# Patient Record
Sex: Male | Born: 1940 | Race: Black or African American | Hispanic: No | Marital: Married | Smoking: Former smoker
Health system: Southern US, Community
[De-identification: ages and names within clinical notes are randomized; demographics above are authoritative.]

## PROBLEM LIST (undated history)

## (undated) DIAGNOSIS — R011 Cardiac murmur, unspecified: Secondary | ICD-10-CM

## (undated) DIAGNOSIS — F329 Major depressive disorder, single episode, unspecified: Secondary | ICD-10-CM

## (undated) DIAGNOSIS — F419 Anxiety disorder, unspecified: Secondary | ICD-10-CM

## (undated) DIAGNOSIS — H409 Unspecified glaucoma: Secondary | ICD-10-CM

## (undated) DIAGNOSIS — K5900 Constipation, unspecified: Secondary | ICD-10-CM

## (undated) DIAGNOSIS — K635 Polyp of colon: Secondary | ICD-10-CM

## (undated) DIAGNOSIS — F32A Depression, unspecified: Secondary | ICD-10-CM

## (undated) DIAGNOSIS — I10 Essential (primary) hypertension: Secondary | ICD-10-CM

## (undated) DIAGNOSIS — E079 Disorder of thyroid, unspecified: Secondary | ICD-10-CM

## (undated) HISTORY — DX: Constipation, unspecified: K59.00

## (undated) HISTORY — DX: Anxiety disorder, unspecified: F41.9

## (undated) HISTORY — DX: Cardiac murmur, unspecified: R01.1

## (undated) HISTORY — DX: Depression, unspecified: F32.A

## (undated) HISTORY — PX: COLONOSCOPY: SHX174

## (undated) HISTORY — DX: Disorder of thyroid, unspecified: E07.9

## (undated) HISTORY — DX: Major depressive disorder, single episode, unspecified: F32.9

## (undated) HISTORY — DX: Unspecified glaucoma: H40.9

## (undated) HISTORY — DX: Polyp of colon: K63.5

---

## 2013-08-12 ENCOUNTER — Encounter (INDEPENDENT_AMBULATORY_CARE_PROVIDER_SITE_OTHER): Payer: Self-pay

## 2013-08-12 ENCOUNTER — Ambulatory Visit: Payer: Medicare Other | Attending: Internal Medicine | Admitting: Internal Medicine

## 2013-08-12 ENCOUNTER — Encounter: Payer: Self-pay | Admitting: Internal Medicine

## 2013-08-12 VITALS — BP 160/90 | HR 62 | Temp 98.5°F | Resp 16 | Ht 65.0 in | Wt 166.0 lb

## 2013-08-12 DIAGNOSIS — E039 Hypothyroidism, unspecified: Secondary | ICD-10-CM | POA: Insufficient documentation

## 2013-08-12 DIAGNOSIS — Z008 Encounter for other general examination: Secondary | ICD-10-CM | POA: Insufficient documentation

## 2013-08-12 DIAGNOSIS — Z139 Encounter for screening, unspecified: Secondary | ICD-10-CM

## 2013-08-12 DIAGNOSIS — Z833 Family history of diabetes mellitus: Secondary | ICD-10-CM | POA: Insufficient documentation

## 2013-08-12 DIAGNOSIS — Z8249 Family history of ischemic heart disease and other diseases of the circulatory system: Secondary | ICD-10-CM | POA: Insufficient documentation

## 2013-08-12 DIAGNOSIS — H409 Unspecified glaucoma: Secondary | ICD-10-CM | POA: Insufficient documentation

## 2013-08-12 DIAGNOSIS — I10 Essential (primary) hypertension: Secondary | ICD-10-CM | POA: Insufficient documentation

## 2013-08-12 LAB — CBC WITH DIFFERENTIAL/PLATELET
Basophils Absolute: 0 10*3/uL (ref 0.0–0.1)
Basophils Relative: 1 % (ref 0–1)
EOS PCT: 3 % (ref 0–5)
Eosinophils Absolute: 0.1 10*3/uL (ref 0.0–0.7)
HCT: 41.3 % (ref 39.0–52.0)
HEMOGLOBIN: 14.1 g/dL (ref 13.0–17.0)
LYMPHS ABS: 1.9 10*3/uL (ref 0.7–4.0)
LYMPHS PCT: 49 % — AB (ref 12–46)
MCH: 31.5 pg (ref 26.0–34.0)
MCHC: 34.1 g/dL (ref 30.0–36.0)
MCV: 92.2 fL (ref 78.0–100.0)
MONOS PCT: 15 % — AB (ref 3–12)
Monocytes Absolute: 0.6 10*3/uL (ref 0.1–1.0)
NEUTROS ABS: 1.3 10*3/uL — AB (ref 1.7–7.7)
Neutrophils Relative %: 32 % — ABNORMAL LOW (ref 43–77)
Platelets: 203 10*3/uL (ref 150–400)
RBC: 4.48 MIL/uL (ref 4.22–5.81)
RDW: 15 % (ref 11.5–15.5)
WBC: 3.9 10*3/uL — ABNORMAL LOW (ref 4.0–10.5)

## 2013-08-12 LAB — COMPLETE METABOLIC PANEL WITH GFR
ALBUMIN: 4 g/dL (ref 3.5–5.2)
ALT: 16 U/L (ref 0–53)
AST: 19 U/L (ref 0–37)
Alkaline Phosphatase: 45 U/L (ref 39–117)
BUN: 9 mg/dL (ref 6–23)
CO2: 27 meq/L (ref 19–32)
CREATININE: 0.89 mg/dL (ref 0.50–1.35)
Calcium: 9 mg/dL (ref 8.4–10.5)
Chloride: 105 mEq/L (ref 96–112)
GFR, Est Non African American: 86 mL/min
Glucose, Bld: 108 mg/dL — ABNORMAL HIGH (ref 70–99)
POTASSIUM: 3.9 meq/L (ref 3.5–5.3)
Sodium: 139 mEq/L (ref 135–145)
Total Bilirubin: 0.6 mg/dL (ref 0.2–1.2)
Total Protein: 7.1 g/dL (ref 6.0–8.3)

## 2013-08-12 LAB — LIPID PANEL
CHOL/HDL RATIO: 3.2 ratio
CHOLESTEROL: 196 mg/dL (ref 0–200)
HDL: 62 mg/dL (ref >39)
LDL Cholesterol: 121 mg/dL — ABNORMAL HIGH (ref 0–99)
Triglycerides: 64 mg/dL (ref <150)
VLDL: 13 mg/dL (ref 0–40)

## 2013-08-12 LAB — TSH: TSH: 1.112 u[IU]/mL (ref 0.350–4.500)

## 2013-08-12 MED ORDER — LEVOTHYROXINE SODIUM 100 MCG PO TABS: 100.0000 ug | ORAL_TABLET | Freq: Every day | ORAL | Status: DC

## 2013-08-12 MED ORDER — AMLODIPINE BESYLATE 5 MG PO TABS: 5.0000 mg | ORAL_TABLET | Freq: Every day | ORAL | Status: DC

## 2013-08-12 NOTE — Progress Notes (Signed)
Patient Demographics  Jeffrey Grimes, is a 73 y.o. male  UXN:[id number]  URK:[id number]  DOB - [birth year]  CC:  Chief Complaint  Patient presents with  . Establish Care  . Hypothyroidism       HPI: Jeffrey Grimes is a 73 y.o. male here today to establish medical care. Patient has history of hypothyroidism and is taking levothyroxine 100 mcg daily, also history of glaucoma following up with his ophthalmologist who has prescribed him eyedrops, today's blood pressure was elevated even repeat manual is 160/90, patient has strong family history of hypertension. Patient has No headache, No chest pain, No abdominal pain - No Nausea, No new weakness tingling or numbness, No Cough - SOB.  No Known Allergies Past Medical History  Diagnosis Date  . Thyroid disease    No current outpatient prescriptions on file prior to visit.   No current facility-administered medications on file prior to visit.   Family History  Problem Relation Age of Onset  . Diabetes Mother   . Hypertension Mother   . Heart disease Mother   . Hypertension Father   . Heart disease Father   . Thyroid disease Sister   . Hypertension Brother   . Diabetes Brother    History   Social History  . Marital Status: Married    Spouse Name: N/A    Number of Children: N/A  . Years of Education: N/A   Occupational History  . Not on file.   Social History Main Topics  . Smoking status: Not on file  . Smokeless tobacco: Not on file  . Alcohol Use: Yes     Comment: socially   . Drug Use: Not on file  . Sexual Activity: Not on file   Other Topics Concern  . Not on file   Social History Narrative  . No narrative on file    Review of Systems: Constitutional: Negative for fever, chills, diaphoresis, activity change, appetite change and fatigue. HENT: Negative for ear pain, nosebleeds, congestion, facial swelling, rhinorrhea, neck pain, neck stiffness and ear discharge.  Eyes: Negative for pain,  discharge, redness, itching and visual disturbance. Respiratory: Negative for cough, choking, chest tightness, shortness of breath, wheezing and stridor.  Cardiovascular: Negative for chest pain, palpitations and leg swelling. Gastrointestinal: Negative for abdominal distention. Genitourinary: Negative for dysuria, urgency, frequency, hematuria, flank pain, decreased urine volume, difficulty urinating and dyspareunia.  Musculoskeletal: Negative for back pain, joint swelling, arthralgia and gait problem. Neurological: Negative for dizziness, tremors, seizures, syncope, facial asymmetry, speech difficulty, weakness, light-headedness, numbness and headaches.  Hematological: Negative for adenopathy. Does not bruise/bleed easily. Psychiatric/Behavioral: Negative for hallucinations, behavioral problems, confusion, dysphoric mood, decreased concentration and agitation.    Objective:   Filed Vitals:   08/12/13 1430  BP: 160/90  Pulse:   Temp:   Resp:     Physical Exam: Constitutional: Patient appears well-developed and well-nourished. No distress. HENT: Normocephalic, atraumatic, External right and left ear normal. Oropharynx is clear and moist.  Eyes: Conjunctivae and EOM are normal. PERRLA, no scleral icterus. Neck: Normal ROM. Neck supple. No JVD. No tracheal deviation. No thyromegaly. CVS: RRR, S1/S2 +, no murmurs, no gallops, no carotid bruit.  Pulmonary: Effort and breath sounds normal, no stridor, rhonchi, wheezes, rales.  Abdominal: Soft. BS +, no distension, tenderness, rebound or guarding.  Musculoskeletal: Normal range of motion. No edema and no tenderness.  Neuro: Alert. Normal reflexes, muscle tone coordination. No cranial nerve deficit. Skin: Skin is warm and dry. No rash  noted. Not diaphoretic. No erythema. No pallor. Psychiatric: Normal mood and affect. Behavior, judgment, thought content normal.  No results found for this basename: WBC, HGB, HCT, MCV, PLT   No results  found for this basename: CREATININE, BUN, NA, K, CL, CO2    No results found for this basename: HGBA1C   Lipid Panel  No results found for this basename: chol, trig, hdl, cholhdl, vldl, ldlcalc       Assessment and plan:   1. Unspecified hypothyroidism  - TSH level - levothyroxine (SYNTHROID, LEVOTHROID) 100 MCG tablet; Take 1 tablet (100 mcg total) by mouth daily.  Dispense: 90 tablet; Refill: 3  2. Glaucoma Continue to follow with the ophthalmologist  4. Essential hypertension, benign I Have started patient on Norvasc 5 mg, also advise for low salt diet, will come back in 2 weeks for BP check  - CBC with Differential - COMPLETE METABOLIC PANEL WITH GFR - Lipid panel - amLODipine (NORVASC) 5 MG tablet; Take 1 tablet (5 mg total) by mouth daily.  Dispense: 90 tablet; Refill: 3      Health Maintenance -Colonoscopy: Up-to-date   Return in about 6 weeks (around 09/23/2013) for BP check in 2 weeks .     [name], MD

## 2013-08-12 NOTE — Patient Instructions (Signed)
2 Gram Low Sodium Diet A 2 gram sodium diet restricts the amount of sodium in the diet to no more than 2 g or 2000 mg daily. Limiting the amount of sodium is often used to help lower blood pressure. It is important if you have heart, liver, or kidney problems. Many foods contain sodium for flavor and sometimes as a preservative. When the amount of sodium in a diet needs to be low, it is important to know what to look for when choosing foods and drinks. The following includes some information and guidelines to help make it easier for you to adapt to a low sodium diet. QUICK TIPS  Do not add salt to food.  Avoid convenience items and fast food.  Choose unsalted snack foods.  Buy lower sodium products, often labeled as "lower sodium" or "no salt added."  Check food labels to learn how much sodium is in 1 serving.  When eating at a restaurant, ask that your food be prepared with less salt or none, if possible. READING FOOD LABELS FOR SODIUM INFORMATION The nutrition facts label is a good place to find how much sodium is in foods. Look for products with no more than 500 to 600 mg of sodium per meal and no more than 150 mg per serving. Remember that 2 g = 2000 mg. The food label may also list foods as:  Sodium-free: Less than 5 mg in a serving.  Very low sodium: 35 mg or less in a serving.  Low-sodium: 140 mg or less in a serving.  Light in sodium: 50% less sodium in a serving. For example, if a food that usually has 300 mg of sodium is changed to become light in sodium, it will have 150 mg of sodium.  Reduced sodium: 25% less sodium in a serving. For example, if a food that usually has 400 mg of sodium is changed to reduced sodium, it will have 300 mg of sodium. CHOOSING FOODS Grains  Avoid: Salted crackers and snack items. Some cereals, including instant hot cereals. Bread stuffing and biscuit mixes. Seasoned rice or pasta mixes.  Choose: Unsalted snack items. Low-sodium cereals, oats,  puffed wheat and rice, shredded wheat. English muffins and bread. Pasta. Meats  Avoid: Salted, canned, smoked, spiced, pickled meats, including fish and poultry. Bacon, ham, sausage, cold cuts, hot dogs, anchovies.  Choose: Low-sodium canned tuna and salmon. Fresh or frozen meat, poultry, and fish. Dairy  Avoid: Processed cheese and spreads. Cottage cheese. Buttermilk and condensed milk. Regular cheese.  Choose: Milk. Low-sodium cottage cheese. Yogurt. Sour cream. Low-sodium cheese. Fruits and Vegetables  Avoid: Regular canned vegetables. Regular canned tomato sauce and paste. Frozen vegetables in sauces. Olives. Pickles. Relishes. Sauerkraut.  Choose: Low-sodium canned vegetables. Low-sodium tomato sauce and paste. Frozen or fresh vegetables. Fresh and frozen fruit. Condiments  Avoid: Canned and packaged gravies. Worcestershire sauce. Tartar sauce. Barbecue sauce. Soy sauce. Steak sauce. Ketchup. Onion, garlic, and table salt. Meat flavorings and tenderizers.  Choose: Fresh and dried herbs and spices. Low-sodium varieties of mustard and ketchup. Lemon juice. Tabasco sauce. Horseradish. SAMPLE 2 GRAM SODIUM MEAL PLAN Breakfast / Sodium (mg)  1 cup low-fat milk / 143 mg  2 slices whole-wheat toast / 270 mg  1 tbs heart-healthy margarine / 153 mg  1 hard-boiled egg / 139 mg  1 small orange / 0 mg Lunch / Sodium (mg)  1 cup raw carrots / 76 mg   cup hummus / 298 mg  1 cup low-fat milk /   143 mg   cup red grapes / 2 mg  1 whole-wheat pita bread / 356 mg Dinner / Sodium (mg)  1 cup whole-wheat pasta / 2 mg  1 cup low-sodium tomato sauce / 73 mg  3 oz lean ground beef / 57 mg  1 small side salad (1 cup raw spinach leaves,  cup cucumber,  cup yellow bell pepper) with 1 tsp olive oil and 1 tsp red wine vinegar / 25 mg Snack / Sodium (mg)  1 container low-fat vanilla yogurt / 107 mg  3 graham cracker squares / 127 mg Nutrient Analysis  Calories: 2033  Protein:  77 g  Carbohydrate: 282 g  Fat: 72 g  Sodium: 1971 mg Document Released: 06/20/2005 Document Revised: 09/12/2011 Document Reviewed: 09/21/2009 Exit[employer] Patient Information 2014 [employer].  

## 2013-08-12 NOTE — Progress Notes (Signed)
Pt here to establish care for thyroid disease. States he ran out of Synthroid 0.1 mg tab Also taking Combigan 0.2/0.5 mg

## 2013-08-13 ENCOUNTER — Telehealth: Payer: Self-pay

## 2013-08-13 NOTE — Telephone Encounter (Signed)
Message copied by Dorothe Pea on Tue Aug 13, 2013  2:40 PM ------      Message from: [name]      Created: Tue Aug 13, 2013  2:31 PM       Blood work reviewed noticed impaired fasting glucose, call and advise patient for low carbohydrate diet.             ------

## 2013-08-13 NOTE — Telephone Encounter (Signed)
Patient not available Left message on voice mail to return call

## 2013-08-27 ENCOUNTER — Ambulatory Visit: Payer: Medicare Other | Attending: Internal Medicine

## 2013-08-27 NOTE — Patient Instructions (Signed)
Pt instructed to keep daily strict log of blood pressure and return next Tuesday for recheck

## 2013-08-27 NOTE — Progress Notes (Unsigned)
Subjective:     Patient ID: Jeffrey Grimes, male   DOB: [birth year], 73 y.o.   MRN: [id number]  HPI   Review of Systems     Objective:   Physical Exam     Assessment:     ***    Plan:     ***     Pt comes in for blood pressure recheck. Taking prescribed Norvasc 5 mg tab daily Denies h/a or pain BP 166/88 Pt told to keep daily bp home monitoring and return next week

## 2013-09-03 ENCOUNTER — Ambulatory Visit: Payer: Medicare Other | Attending: Internal Medicine

## 2013-09-03 NOTE — Patient Instructions (Signed)
Pt instructed to continue taking prescribed medication and return at next appt

## 2013-09-03 NOTE — Progress Notes (Unsigned)
Subjective:     Patient ID: Jeffrey Grimes, male   DOB: 1941-02-20, 73 y.o.   MRN: [id number]  HPI   Review of Systems     Objective:   Physical Exam     Assessment:     ***    Plan:     ***     Pt here for blood pressure recheck. Pt brought in daily bp log ranging 130-140's/70-80's  Taking Amlodopine 5 mg tab. May need to increase at next visit BP 163/90 rechecked manually post 10 minutes 145/87 73 Instructed to continue taking dosage and return at next scheduled appt

## 2013-09-12 ENCOUNTER — Ambulatory Visit: Payer: Medicare Other | Attending: Internal Medicine | Admitting: Internal Medicine

## 2013-09-12 VITALS — BP 146/88 | HR 68 | Temp 98.6°F | Resp 16 | Ht 65.0 in | Wt 165.0 lb

## 2013-09-12 DIAGNOSIS — I10 Essential (primary) hypertension: Secondary | ICD-10-CM

## 2013-09-12 DIAGNOSIS — G4700 Insomnia, unspecified: Secondary | ICD-10-CM

## 2013-09-12 DIAGNOSIS — R7301 Impaired fasting glucose: Secondary | ICD-10-CM

## 2013-09-12 DIAGNOSIS — E039 Hypothyroidism, unspecified: Secondary | ICD-10-CM

## 2013-09-12 NOTE — Progress Notes (Signed)
MRN: [id number] Name: Jeffrey Grimes  Sex: male Age: 73 y.o. DOB: [birth year]  Allergies: Review of patient's allergies indicates no known allergies.  Chief Complaint  Patient presents with  . Follow-up  . Hypertension    HPI: Patient is 73 y.o. male who comes today for followup, history of hypothyroidism and is on levothyroxine, history of hypertension last visit he was started on Norvasc 5 mg his blood pressure has trended down, denies any headache dizziness chest and shortness of breath, blood work reviewed with the patient, patient reported to have problem with the sleep.  Past Medical History  Diagnosis Date  . Thyroid disease     Past Surgical History  Procedure Laterality Date  . Colonoscopy      last done at the age ot 45, had total of 3 times done       Medication List       This list is accurate as of: 09/12/13 11:03 AM.  Always use your most recent med list.               amLODipine 5 MG tablet  Commonly known as:  NORVASC  Take 1 tablet (5 mg total) by mouth daily.     levothyroxine 100 MCG tablet  Commonly known as:  SYNTHROID, LEVOTHROID  Take 1 tablet (100 mcg total) by mouth daily.        No orders of the defined types were placed in this encounter.     There is no immunization history on file for this patient.  Family History  Problem Relation Age of Onset  . Diabetes Mother   . Hypertension Mother   . Heart disease Mother   . Hypertension Father   . Heart disease Father   . Thyroid disease Sister   . Hypertension Brother   . Diabetes Brother     History  Substance Use Topics  . Smoking status: Not on file  . Smokeless tobacco: Not on file  . Alcohol Use: Yes     Comment: socially     Review of Systems   As noted in HPI  Filed Vitals:   09/12/13 1040  BP: 146/88  Pulse: 68  Temp: 98.6 F (37 C)  Resp: 16    Physical Exam  Physical Exam  Constitutional: No distress.  Eyes: EOM are normal. Pupils are equal,  round, and reactive to light.  Cardiovascular: Normal rate and regular rhythm.   Pulmonary/Chest: Breath sounds normal. No respiratory distress. He has no wheezes. He has no rales.  Musculoskeletal: He exhibits no edema.    CBC    Component Value Date/Time   WBC 3.9* 08/12/2013 1437   RBC 4.48 08/12/2013 1437   HGB 14.1 08/12/2013 1437   HCT 41.3 08/12/2013 1437   PLT 203 08/12/2013 1437   MCV 92.2 08/12/2013 1437   LYMPHSABS 1.9 08/12/2013 1437   MONOABS 0.6 08/12/2013 1437   EOSABS 0.1 08/12/2013 1437   BASOSABS 0.0 08/12/2013 1437    CMP     Component Value Date/Time   NA 139 08/12/2013 1437   K 3.9 08/12/2013 1437   CL 105 08/12/2013 1437   CO2 27 08/12/2013 1437   GLUCOSE 108* 08/12/2013 1437   BUN 9 08/12/2013 1437   CREATININE 0.89 08/12/2013 1437   CALCIUM 9.0 08/12/2013 1437   PROT 7.1 08/12/2013 1437   ALBUMIN 4.0 08/12/2013 1437   AST 19 08/12/2013 1437   ALT 16 08/12/2013 1437   ALKPHOS 45 08/12/2013 1437  BILITOT 0.6 08/12/2013 1437    Lab Results  Component Value Date/Time   CHOL 196 08/12/2013  2:37 PM    No components found with this basename: hga1c    Lab Results  Component Value Date/Time   AST 19 08/12/2013  2:37 PM    Assessment and Plan  Essential hypertension, benign Improved her continue with Norvasc 5 mg, advise for low salt diet. Her  Unspecified hypothyroidism TSH level is within normal range continue with levothyroxine 100 mcg daily  IFG (impaired fasting glucose) Advise patient for low carbohydrate diet   Insomnia Advised patient for lifestyle modification and sleep hygiene, patient will try over-the-counter melatonin.   Return in about 3 months (around 12/13/2013) for hypertension, hypothyroid.  [name], MD

## 2013-09-12 NOTE — Patient Instructions (Signed)
DASH Diet  The DASH diet stands for "Dietary Approaches to Stop Hypertension." It is a healthy eating plan that has been shown to reduce high blood pressure (hypertension) in as little as 14 days, while also possibly providing other significant health benefits. These other health benefits include reducing the risk of breast cancer after menopause and reducing the risk of type 2 diabetes, heart disease, colon cancer, and stroke. Health benefits also include weight loss and slowing kidney failure in patients with chronic kidney disease.   DIET GUIDELINES  · Limit salt (sodium). Your diet should contain less than 1500 mg of sodium daily.  · Limit refined or processed carbohydrates. Your diet should include mostly whole grains. Desserts and added sugars should be used sparingly.  · Include small amounts of heart-healthy fats. These types of fats include nuts, oils, and tub margarine. Limit saturated and trans fats. These fats have been shown to be harmful in the body.  CHOOSING FOODS   The following food groups are based on a 2000 calorie diet. See your Registered Dietitian for individual calorie needs.  Grains and Grain Products (6 to 8 servings daily)  · Eat More Often: Whole-wheat bread, brown rice, whole-grain or wheat pasta, quinoa, popcorn without added fat or salt (air popped).  · Eat Less Often: White bread, white pasta, white rice, cornbread.  Vegetables (4 to 5 servings daily)  · Eat More Often: Fresh, frozen, and canned vegetables. Vegetables may be raw, steamed, roasted, or grilled with a minimal amount of fat.  · Eat Less Often/Avoid: Creamed or fried vegetables. Vegetables in a cheese sauce.  Fruit (4 to 5 servings daily)  · Eat More Often: All fresh, canned (in natural juice), or frozen fruits. Dried fruits without added sugar. One hundred percent fruit juice (½ cup [237 mL] daily).  · Eat Less Often: Dried fruits with added sugar. Canned fruit in light or heavy syrup.  Lean Meats, Fish, and Poultry (2  servings or less daily. One serving is 3 to 4 oz [85-114 g]).  · Eat More Often: Ninety percent or leaner ground beef, tenderloin, sirloin. Round cuts of beef, chicken breast, turkey breast. All fish. Grill, bake, or broil your meat. Nothing should be fried.  · Eat Less Often/Avoid: Fatty cuts of meat, turkey, or chicken leg, thigh, or wing. Fried cuts of meat or fish.  Dairy (2 to 3 servings)  · Eat More Often: Low-fat or fat-free milk, low-fat plain or light yogurt, reduced-fat or part-skim cheese.  · Eat Less Often/Avoid: Milk (whole, 2%). Whole milk yogurt. Full-fat cheeses.  Nuts, Seeds, and Legumes (4 to 5 servings per week)  · Eat More Often: All without added salt.  · Eat Less Often/Avoid: Salted nuts and seeds, canned beans with added salt.  Fats and Sweets (limited)  · Eat More Often: Vegetable oils, tub margarines without trans fats, sugar-free gelatin. Mayonnaise and salad dressings.  · Eat Less Often/Avoid: Coconut oils, palm oils, butter, stick margarine, cream, half and half, cookies, candy, pie.  FOR MORE INFORMATION  The Dash Diet Eating Plan: www.dashdiet.org  Document Released: 06/09/2011 Document Revised: 09/12/2011 Document Reviewed: 06/09/2011  Exit[employer]® Patient Information ©2014 [employer].

## 2013-12-16 ENCOUNTER — Encounter: Payer: Self-pay | Admitting: Internal Medicine

## 2013-12-17 ENCOUNTER — Encounter: Payer: Self-pay | Admitting: Podiatry

## 2013-12-17 ENCOUNTER — Ambulatory Visit (INDEPENDENT_AMBULATORY_CARE_PROVIDER_SITE_OTHER): Payer: Medicare Other | Admitting: Podiatry

## 2013-12-17 VITALS — BP 137/67 | HR 63 | Ht 65.0 in | Wt 166.0 lb

## 2013-12-17 DIAGNOSIS — B351 Tinea unguium: Secondary | ICD-10-CM

## 2013-12-17 DIAGNOSIS — M79606 Pain in leg, unspecified: Secondary | ICD-10-CM | POA: Insufficient documentation

## 2013-12-17 DIAGNOSIS — M79609 Pain in unspecified limb: Secondary | ICD-10-CM

## 2013-12-17 DIAGNOSIS — Q828 Other specified congenital malformations of skin: Secondary | ICD-10-CM

## 2013-12-17 NOTE — Progress Notes (Signed)
SUBJECTIVE: 73 y.o. year old male presents for painful calluses on both feet. He himself trims them down every two weeks. They are painful to walk.  REVIEW OF SYSTEMS: Constitutional: negative for chills, fatigue, fevers, night sweats and weight loss Eyes: Has Glaucoma. Ears, nose, mouth, throat, and face: negative Respiratory: negative Cardiovascular: negative Gastrointestinal: negative Genitourinary:negative Musculoskeletal:negative Neurological: negative.  OBJECTIVE: DERMATOLOGIC EXAMINATION: Nails: Thick dystrophic nails x 10. Plantar Calluses ball of both feet. VASCULAR EXAMINATION OF LOWER LIMBS: Pedal pulses: All pedal pulses are palpable with normal pulsation.  NEUROLOGIC EXAMINATION OF THE LOWER LIMBS: All epicritic and tactile sensations grossly intact bilateral. 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   ASSESSMENT: Painful calluses bilateral. 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 Bilateral bunion. Pain in lower limb.  PLAN: Reviewed findings. All nails and calluses debrided.

## 2013-12-17 NOTE — Patient Instructions (Signed)
Seen for hypertrophic nails and painful calluses. All nails and calluses debrided. Return in 3 months or as needed.  

## 2014-01-13 ENCOUNTER — Telehealth: Payer: Self-pay | Admitting: *Deleted

## 2014-02-05 ENCOUNTER — Other Ambulatory Visit: Payer: Self-pay

## 2014-02-05 ENCOUNTER — Ambulatory Visit (HOSPITAL_COMMUNITY)
Admission: RE | Admit: 2014-02-05 | Discharge: 2014-02-05 | Disposition: A | Discharge status: Home / Self Care | Payer: Medicare Other | Source: Ambulatory Visit | Attending: Cardiology | Admitting: Cardiology

## 2014-02-05 ENCOUNTER — Ambulatory Visit: Payer: Medicare Other | Attending: Internal Medicine | Admitting: Internal Medicine

## 2014-02-05 ENCOUNTER — Encounter: Payer: Self-pay | Admitting: Internal Medicine

## 2014-02-05 VITALS — BP 160/90 | HR 67 | Temp 98.8°F | Resp 16 | Wt 165.4 lb

## 2014-02-05 DIAGNOSIS — Z8249 Family history of ischemic heart disease and other diseases of the circulatory system: Secondary | ICD-10-CM | POA: Diagnosis present

## 2014-02-05 DIAGNOSIS — F329 Major depressive disorder, single episode, unspecified: Secondary | ICD-10-CM

## 2014-02-05 DIAGNOSIS — Z87891 Personal history of nicotine dependence: Secondary | ICD-10-CM | POA: Insufficient documentation

## 2014-02-05 DIAGNOSIS — R7301 Impaired fasting glucose: Secondary | ICD-10-CM

## 2014-02-05 DIAGNOSIS — F3289 Other specified depressive episodes: Secondary | ICD-10-CM | POA: Insufficient documentation

## 2014-02-05 DIAGNOSIS — F32A Depression, unspecified: Secondary | ICD-10-CM

## 2014-02-05 DIAGNOSIS — I10 Essential (primary) hypertension: Secondary | ICD-10-CM

## 2014-02-05 DIAGNOSIS — E039 Hypothyroidism, unspecified: Secondary | ICD-10-CM | POA: Diagnosis not present

## 2014-02-05 LAB — COMPLETE METABOLIC PANEL WITH GFR
ALK PHOS: 49 U/L (ref 39–117)
ALT: 18 U/L (ref 0–53)
AST: 21 U/L (ref 0–37)
Albumin: 4 g/dL (ref 3.5–5.2)
BUN: 10 mg/dL (ref 6–23)
CALCIUM: 9.6 mg/dL (ref 8.4–10.5)
CHLORIDE: 104 meq/L (ref 96–112)
CO2: 29 mEq/L (ref 19–32)
Creat: 0.9 mg/dL (ref 0.50–1.35)
GFR, Est African American: >89 mL/min
GFR, Est Non African American: 86 mL/min
GLUCOSE: 110 mg/dL — AB (ref 70–99)
POTASSIUM: 4.3 meq/L (ref 3.5–5.3)
SODIUM: 138 meq/L (ref 135–145)
TOTAL PROTEIN: 7.3 g/dL (ref 6.0–8.3)
Total Bilirubin: 0.5 mg/dL (ref 0.2–1.2)

## 2014-02-05 MED ORDER — LISINOPRIL-HYDROCHLOROTHIAZIDE 10-12.5 MG PO TABS: 1.0000 | ORAL_TABLET | Freq: Every day | ORAL | Status: DC

## 2014-02-05 NOTE — Progress Notes (Signed)
MRN: [id number] Name: Jeffrey Grimes  Sex: male Age: 73 y.o. DOB: [birth year]  Allergies: Review of patient's allergies indicates no known allergies.  Chief Complaint  Patient presents with  . Follow-up  . Depression    HPI: Patient is 73 y.o. male who has history of hypothyroidism, hypertension patient was on Norvasc as per patient he took only for one month and discontinued because he didn't like the medication, today's blood pressure is elevated denies any headache dizziness chest pain or shortness of breath, patient does have family history of heart disease today EKG done in the office is normal sinus rhythm, patient also has history of depression as per patient he saw a [place] physician and was prescribed Paxil which he is going to start, denies any SI or HI.  Past Medical History  Diagnosis Date  . Thyroid disease     Past Surgical History  Procedure Laterality Date  . Colonoscopy      last done at the age ot 76, had total of 3 times done       Medication List       This list is accurate as of: 02/05/14 12:38 PM.  Always use your most recent med list.               levothyroxine 100 MCG tablet  Commonly known as:  SYNTHROID, LEVOTHROID  Take 1 tablet (100 mcg total) by mouth daily.     lisinopril-hydrochlorothiazide 10-12.5 MG per tablet  Commonly known as:  PRINZIDE,ZESTORETIC  Take 1 tablet by mouth daily.        Meds ordered this encounter  Medications  . lisinopril-hydrochlorothiazide (PRINZIDE,ZESTORETIC) 10-12.5 MG per tablet    Sig: Take 1 tablet by mouth daily.    Dispense:  90 tablet    Refill:  1     There is no immunization history on file for this patient.  Family History  Problem Relation Age of Onset  . Diabetes Mother   . Hypertension Mother   . Heart disease Mother   . Hypertension Father   . Heart disease Father   . Thyroid disease Sister   . Hypertension Brother   . Diabetes Brother     History  Substance Use Topics  .  Smoking status: Former Research scientist (life sciences)  . Smokeless tobacco: Never Used  . Alcohol Use: Yes     Comment: socially     Review of Systems   As noted in HPI  Filed Vitals:   02/05/14 1235  BP: 160/90  Pulse:   Temp:   Resp:     Physical Exam  Physical Exam  Constitutional: No distress.  Eyes: EOM are normal. Pupils are equal, round, and reactive to light.  Cardiovascular: Normal rate and regular rhythm.   Pulmonary/Chest: Breath sounds normal. No respiratory distress. He has no wheezes. He has no rales.  Musculoskeletal: He exhibits no edema.    CBC    Component Value Date/Time   WBC 3.9* 08/12/2013 1437   RBC 4.48 08/12/2013 1437   HGB 14.1 08/12/2013 1437   HCT 41.3 08/12/2013 1437   PLT 203 08/12/2013 1437   MCV 92.2 08/12/2013 1437   LYMPHSABS 1.9 08/12/2013 1437   MONOABS 0.6 08/12/2013 1437   EOSABS 0.1 08/12/2013 1437   BASOSABS 0.0 08/12/2013 1437    CMP     Component Value Date/Time   NA 139 08/12/2013 1437   K 3.9 08/12/2013 1437   CL 105 08/12/2013 1437   CO2 27 08/12/2013  1437   GLUCOSE 108* 08/12/2013 1437   BUN 9 08/12/2013 1437   CREATININE 0.89 08/12/2013 1437   CALCIUM 9.0 08/12/2013 1437   PROT 7.1 08/12/2013 1437   ALBUMIN 4.0 08/12/2013 1437   AST 19 08/12/2013 1437   ALT 16 08/12/2013 1437   ALKPHOS 45 08/12/2013 1437   BILITOT 0.6 08/12/2013 1437   GFRNONAA 86 08/12/2013 1437   GFRAA >89 08/12/2013 1437    Lab Results  Component Value Date/Time   CHOL 196 08/12/2013  2:37 PM    No components found with this basename: hga1c    Lab Results  Component Value Date/Time   AST 19 08/12/2013  2:37 PM    Assessment and Plan  Essential hypertension, benign - Plan: Advised patient for DASH diet and compliance with the medication, I have started him on lisinopril-hydrochlorothiazide (PRINZIDE,ZESTORETIC) 10-12.5 MG per tablet, will check her COMPLETE METABOLIC PANEL WITH GFR, patient will come back in 2 weeks for nurse visit for BP check.  Unspecified hypothyroidism - Plan: Will repeat TSH  level currently patient is on levothyroxine 100 mcg daily.  IFG (impaired fasting glucose) - Plan: Advised patient for low carbohydrate diet, repeat COMPLETE METABOLIC PANEL WITH GFR  Depression Patient is going to start taking Paxil 10 mg daily.   Return in about 3 months (around 05/08/2014) for hypertension, hypothyroid, BP check in 2 weeks/Nurse Visit.  [name], MD

## 2014-02-05 NOTE — Progress Notes (Signed)
Patient states when he wakes up in the morning  The top of his head is oily States suffers from depression as well Requesting and EKG for preventative measures due to heart Condition that runs in his family

## 2014-02-05 NOTE — Patient Instructions (Signed)
DASH Eating Plan °DASH stands for "Dietary Approaches to Stop Hypertension." The DASH eating plan is a healthy eating plan that has been shown to reduce high blood pressure (hypertension). Additional health benefits may include reducing the risk of type 2 diabetes mellitus, heart disease, and stroke. The DASH eating plan may also help with weight loss. °WHAT DO I NEED TO KNOW ABOUT THE DASH EATING PLAN? °For the DASH eating plan, you will follow these general guidelines: °· Choose foods with a percent daily value for sodium of less than 5% (as listed on the food label). °· Use salt-free seasonings or herbs instead of table salt or sea salt. °· Check with your health care provider or pharmacist before using salt substitutes. °· Eat lower-sodium products, often labeled as "lower sodium" or "no salt added." °· Eat fresh foods. °· Eat more vegetables, fruits, and low-fat dairy products. °· Choose whole grains. Look for the word "whole" as the first word in the ingredient list. °· Choose fish and skinless chicken or turkey more often than red meat. Limit fish, poultry, and meat to 6 oz (170 g) each day. °· Limit sweets, desserts, sugars, and sugary drinks. °· Choose heart-healthy fats. °· Limit cheese to 1 oz (28 g) per day. °· Eat more home-cooked food and less restaurant, buffet, and fast food. °· Limit fried foods. °· Cook foods using methods other than frying. °· Limit canned vegetables. If you do use them, rinse them well to decrease the sodium. °· When eating at a restaurant, ask that your food be prepared with less salt, or no salt if possible. °WHAT FOODS CAN I EAT? °Seek help from a dietitian for individual calorie needs. °Grains °Whole grain or whole wheat bread. Brown rice. Whole grain or whole wheat pasta. Quinoa, bulgur, and whole grain cereals. Low-sodium cereals. Corn or whole wheat flour tortillas. Whole grain cornbread. Whole grain crackers. Low-sodium crackers. °Vegetables °Fresh or frozen vegetables  (raw, steamed, roasted, or grilled). Low-sodium or reduced-sodium tomato and vegetable juices. Low-sodium or reduced-sodium tomato sauce and paste. Low-sodium or reduced-sodium canned vegetables.  °Fruits °All fresh, canned (in natural juice), or frozen fruits. °Meat and Other Protein Products °Ground beef (85% or leaner), grass-fed beef, or beef trimmed of fat. Skinless chicken or turkey. Ground chicken or turkey. Pork trimmed of fat. All fish and seafood. Eggs. Dried beans, peas, or lentils. Unsalted nuts and seeds. Unsalted canned beans. °Dairy °Low-fat dairy products, such as skim or 1% milk, 2% or reduced-fat cheeses, low-fat ricotta or cottage cheese, or plain low-fat yogurt. Low-sodium or reduced-sodium cheeses. °Fats and Oils °Tub margarines without trans fats. Light or reduced-fat mayonnaise and salad dressings (reduced sodium). Avocado. Safflower, olive, or canola oils. Natural peanut or almond butter. °Other °Unsalted popcorn and pretzels. °The items listed above may not be a complete list of recommended foods or beverages. Contact your dietitian for more options. °WHAT FOODS ARE NOT RECOMMENDED? °Grains °White bread. White pasta. White rice. Refined cornbread. Bagels and croissants. Crackers that contain trans fat. °Vegetables °Creamed or fried vegetables. Vegetables in a cheese sauce. Regular canned vegetables. Regular canned tomato sauce and paste. Regular tomato and vegetable juices. °Fruits °Dried fruits. Canned fruit in light or heavy syrup. Fruit juice. °Meat and Other Protein Products °Fatty cuts of meat. Ribs, chicken wings, bacon, sausage, bologna, salami, chitterlings, fatback, hot dogs, bratwurst, and packaged luncheon meats. Salted nuts and seeds. Canned beans with salt. °Dairy °Whole or 2% milk, cream, half-and-half, and cream cheese. Whole-fat or sweetened yogurt. Full-fat   cheeses or blue cheese. Nondairy creamers and whipped toppings. Processed cheese, cheese spreads, or cheese  curds. °Condiments °Onion and garlic salt, seasoned salt, table salt, and sea salt. Canned and packaged gravies. Worcestershire sauce. Tartar sauce. Barbecue sauce. Teriyaki sauce. Soy sauce, including reduced sodium. Steak sauce. Fish sauce. Oyster sauce. Cocktail sauce. Horseradish. Ketchup and mustard. Meat flavorings and tenderizers. Bouillon cubes. Hot sauce. Tabasco sauce. Marinades. Taco seasonings. Relishes. °Fats and Oils °Butter, stick margarine, lard, shortening, ghee, and bacon fat. Coconut, palm kernel, or palm oils. Regular salad dressings. °Other °Pickles and olives. Salted popcorn and pretzels. °The items listed above may not be a complete list of foods and beverages to avoid. Contact your dietitian for more information. °WHERE CAN I FIND MORE INFORMATION? °National Heart, [employer], and [employer] Institute: www.nhlbi.nih.gov/health/health-topics/topics/dash/ °Document Released: 06/09/2011 Document Revised: 11/04/2013 Document Reviewed: 04/24/2013 °ExitCare® Patient Information ©2015 [employer]. This information is not intended to replace advice given to you by your health care provider. Make sure you discuss any questions you have with your health care provider. ° °

## 2014-02-06 LAB — TSH: TSH: 2.981 u[IU]/mL (ref 0.350–4.500)

## 2014-02-07 ENCOUNTER — Telehealth: Payer: Self-pay | Admitting: *Deleted

## 2014-02-07 NOTE — Telephone Encounter (Signed)
Left message on VM to return my call. 

## 2014-02-07 NOTE — Telephone Encounter (Signed)
Message copied by Joan Mayans on Fri Feb 07, 2014  9:37 AM ------      Message from: [name]      Created: Thu Feb 06, 2014  9:20 AM       Blood work reviewed noticed impaired fasting glucose, call and advise patient for low carbohydrate diet.      Also his TSH level is in normal range, advise patient to continue with the same dose of levothyroxine. ------

## 2014-02-19 ENCOUNTER — Ambulatory Visit: Payer: Medicare Other | Attending: Internal Medicine | Admitting: *Deleted

## 2014-02-19 VITALS — BP 165/82 | HR 71 | Temp 98.1°F | Resp 16

## 2014-02-19 DIAGNOSIS — I10 Essential (primary) hypertension: Secondary | ICD-10-CM

## 2014-02-19 MED ORDER — LOSARTAN POTASSIUM-HCTZ 50-12.5 MG PO TABS: 1.0000 | ORAL_TABLET | Freq: Every day | ORAL | Status: DC

## 2014-02-19 NOTE — Progress Notes (Signed)
Patient here for BP check. Patient states he has been having a nagging cough. Patient states its worse at night. Patient states he has not been taking his Lisinopril-HCTZ for one week. Consulted with Dr. Annitta Needs. Verbal order received for Losartan-HCTZ 50-12.5 mg and return in two weeks for nurse visit for BP recheck. Also informed patient to check with [place] doctor about if depression medication is causing his cough also. Patient verbalized understanding. Vivia Birmingham, RN

## 2014-02-19 NOTE — Patient Instructions (Signed)
Hydrochlorothiazide, HCTZ; Losartan tablets What is this medicine? LOSARTAN; HYDROCHLOROTHIAZIDE (loe SAR tan; hye droe klor oh THYE a zide) is a combination of a drug that relaxes blood vessels and a diuretic. It is used to treat high blood pressure. This medicine may also reduce the risk of stroke in certain patients. This medicine may be used for other purposes; ask your health care provider or pharmacist if you have questions. COMMON BRAND NAME(S): Hyzaar What should I tell my health care provider before I take this medicine? They need to know if you have any of these conditions: -decreased urine -kidney disease -liver disease -if you are on a special diet, like a low-salt diet -immune system problems, like lupus -an unusual or allergic reaction to losartan, hydrochlorothiazide, sulfa drugs, other medicines, foods, dyes, or preservatives -pregnant or trying to get pregnant -breast-feeding How should I use this medicine? Take this medicine by mouth with a glass of water. Follow the directions on the prescription label. You can take it with or without food. If it upsets your stomach, take it with food. Take your medicine at regular intervals. Do not take it more often than directed. Do not stop taking except on your doctor's advice. Talk to your pediatrician regarding the use of this medicine in children. Special care may be needed. Overdosage: If you think you have taken too much of this medicine contact a poison control center or emergency room at once. NOTE: This medicine is only for you. Do not share this medicine with others. What if I miss a dose? If you miss a dose, take it as soon as you can. If it is almost time for your next dose, take only that dose. Do not take double or extra doses. What may interact with this medicine? -barbiturates, like phenobarbital -blood pressure medicines -celecoxib -cimetidine -corticosteroids -diabetic medicines -diuretics, especially triamterene,  spironolactone or amiloride -fluconazole -lithium -NSAIDs, medicines for pain and inflammation, like ibuprofen or naproxen -potassium salts or potassium supplements -prescription pain medicines -rifampin -skeletal muscle relaxants like tubocurarine -some cholesterol-lowering medicines like cholestyramine or colestipol This list may not describe all possible interactions. Give your health care provider a list of all the medicines, herbs, non-prescription drugs, or dietary supplements you use. Also tell them if you smoke, drink alcohol, or use illegal drugs. Some items may interact with your medicine. What should I watch for while using this medicine? Check your blood pressure regularly while you are taking this medicine. Ask your doctor or health care professional what your blood pressure should be and when you should contact him or her. When you check your blood pressure, write down the measurements to show your doctor or health care professional. If you are taking this medicine for a long time, you must visit your health care professional for regular checks on your progress. Make sure you schedule appointments on a regular basis. You must not get dehydrated. Ask your doctor or health care professional how much fluid you need to drink a day. Check with him or her if you get an attack of severe diarrhea, nausea and vomiting, or if you sweat a lot. The loss of too much body fluid can make it dangerous for you to take this medicine. Women should inform their doctor if they wish to become pregnant or think they might be pregnant. There is a potential for serious side effects to an unborn child, particularly in the second or third trimester. Talk to your health care professional or pharmacist for more information.  You may get drowsy or dizzy. Do not drive, use machinery, or do anything that needs mental alertness until you know how this drug affects you. Do not stand or sit up quickly, especially if you are  an older patient. This reduces the risk of dizzy or fainting spells. Alcohol can make you more drowsy and dizzy. Avoid alcoholic drinks. This medicine may affect your blood sugar level. If you have diabetes, check with your doctor or health care professional before changing the dose of your diabetic medicine. Avoid salt substitutes unless you are told otherwise by your doctor or health care professional. Do not treat yourself for coughs, colds, or pain while you are taking this medicine without asking your doctor or health care professional for advice. Some ingredients may increase your blood pressure. What side effects may I notice from receiving this medicine? Side effects that you should report to your doctor or health care professional as soon as possible: -allergic reactions like skin rash, itching or hives, swelling of the face, lips, or tongue -breathing problems -changes in vision -dark urine -eye pain -fast or irregular heart beat, palpitations, or chest pain -feeling faint or lightheaded -muscle cramps -persistent dry cough -redness, blistering, peeling or loosening of the skin, including inside the mouth -stomach pain -trouble passing urine or change in the amount of urine -unusual bleeding or bruising -worsened gout pain -yellowing of the eyes or skin Side effects that usually do not require medical attention (report to your doctor or health care professional if they continue or are bothersome): -change in sex drive or performance -headache This list may not describe all possible side effects. Call your doctor for medical advice about side effects. You may report side effects to FDA at 1-800-FDA-1088. Where should I keep my medicine? Keep out of the reach of children. Store at room temperature between 15 and 30 degrees C (59 and 86 degrees F). Protect from light. Keep container tightly closed. Throw away any unused medicine after the expiration date. NOTE: This sheet is a  summary. It may not cover all possible information. If you have questions about this medicine, talk to your doctor, pharmacist, or health care provider.  2015, [employer]. (2010-03-10 13:57:32)

## 2014-03-05 ENCOUNTER — Ambulatory Visit: Payer: Medicare Other | Attending: Internal Medicine

## 2014-03-05 ENCOUNTER — Telehealth: Payer: Self-pay | Admitting: *Deleted

## 2014-03-05 NOTE — Progress Notes (Unsigned)
Patient here for BP check. Patient was taken off Lisinopril-HCTZ because it was causing him to cough. Patient denies chest pain, blurred vision, and headaches. Patient states he is still having this cough which its worse at night. Patient denies fever, night sweats, patient is fatigued because he does not sleep good at night, patient denies coughing up any blood.

## 2014-03-05 NOTE — Patient Instructions (Signed)
Hydrochlorothiazide, HCTZ; Losartan tablets What is this medicine? LOSARTAN; HYDROCHLOROTHIAZIDE (loe SAR tan; hye droe klor oh THYE a zide) is a combination of a drug that relaxes blood vessels and a diuretic. It is used to treat high blood pressure. This medicine may also reduce the risk of stroke in certain patients. This medicine may be used for other purposes; ask your health care provider or pharmacist if you have questions. COMMON BRAND NAME(S): Hyzaar What should I tell my health care provider before I take this medicine? They need to know if you have any of these conditions: -decreased urine -kidney disease -liver disease -if you are on a special diet, like a low-salt diet -immune system problems, like lupus -an unusual or allergic reaction to losartan, hydrochlorothiazide, sulfa drugs, other medicines, foods, dyes, or preservatives -pregnant or trying to get pregnant -breast-feeding How should I use this medicine? Take this medicine by mouth with a glass of water. Follow the directions on the prescription label. You can take it with or without food. If it upsets your stomach, take it with food. Take your medicine at regular intervals. Do not take it more often than directed. Do not stop taking except on your doctor's advice. Talk to your pediatrician regarding the use of this medicine in children. Special care may be needed. Overdosage: If you think you have taken too much of this medicine contact a poison control center or emergency room at once. NOTE: This medicine is only for you. Do not share this medicine with others. What if I miss a dose? If you miss a dose, take it as soon as you can. If it is almost time for your next dose, take only that dose. Do not take double or extra doses. What may interact with this medicine? -barbiturates, like phenobarbital -blood pressure medicines -celecoxib -cimetidine -corticosteroids -diabetic medicines -diuretics, especially triamterene,  spironolactone or amiloride -fluconazole -lithium -NSAIDs, medicines for pain and inflammation, like ibuprofen or naproxen -potassium salts or potassium supplements -prescription pain medicines -rifampin -skeletal muscle relaxants like tubocurarine -some cholesterol-lowering medicines like cholestyramine or colestipol This list may not describe all possible interactions. Give your health care provider a list of all the medicines, herbs, non-prescription drugs, or dietary supplements you use. Also tell them if you smoke, drink alcohol, or use illegal drugs. Some items may interact with your medicine. What should I watch for while using this medicine? Check your blood pressure regularly while you are taking this medicine. Ask your doctor or health care professional what your blood pressure should be and when you should contact him or her. When you check your blood pressure, write down the measurements to show your doctor or health care professional. If you are taking this medicine for a long time, you must visit your health care professional for regular checks on your progress. Make sure you schedule appointments on a regular basis. You must not get dehydrated. Ask your doctor or health care professional how much fluid you need to drink a day. Check with him or her if you get an attack of severe diarrhea, nausea and vomiting, or if you sweat a lot. The loss of too much body fluid can make it dangerous for you to take this medicine. Women should inform their doctor if they wish to become pregnant or think they might be pregnant. There is a potential for serious side effects to an unborn child, particularly in the second or third trimester. Talk to your health care professional or pharmacist for more information.  You may get drowsy or dizzy. Do not drive, use machinery, or do anything that needs mental alertness until you know how this drug affects you. Do not stand or sit up quickly, especially if you are  an older patient. This reduces the risk of dizzy or fainting spells. Alcohol can make you more drowsy and dizzy. Avoid alcoholic drinks. This medicine may affect your blood sugar level. If you have diabetes, check with your doctor or health care professional before changing the dose of your diabetic medicine. Avoid salt substitutes unless you are told otherwise by your doctor or health care professional. Do not treat yourself for coughs, colds, or pain while you are taking this medicine without asking your doctor or health care professional for advice. Some ingredients may increase your blood pressure. What side effects may I notice from receiving this medicine? Side effects that you should report to your doctor or health care professional as soon as possible: -allergic reactions like skin rash, itching or hives, swelling of the face, lips, or tongue -breathing problems -changes in vision -dark urine -eye pain -fast or irregular heart beat, palpitations, or chest pain -feeling faint or lightheaded -muscle cramps -persistent dry cough -redness, blistering, peeling or loosening of the skin, including inside the mouth -stomach pain -trouble passing urine or change in the amount of urine -unusual bleeding or bruising -worsened gout pain -yellowing of the eyes or skin Side effects that usually do not require medical attention (report to your doctor or health care professional if they continue or are bothersome): -change in sex drive or performance -headache This list may not describe all possible side effects. Call your doctor for medical advice about side effects. You may report side effects to FDA at 1-800-FDA-1088. Where should I keep my medicine? Keep out of the reach of children. Store at room temperature between 15 and 30 degrees C (59 and 86 degrees F). Protect from light. Keep container tightly closed. Throw away any unused medicine after the expiration date. NOTE: This sheet is a  summary. It may not cover all possible information. If you have questions about this medicine, talk to your doctor, pharmacist, or health care provider.  2015, [employer]. (2010-03-10 13:57:32)

## 2014-03-05 NOTE — Telephone Encounter (Signed)
Patient still c/o having cough after being off Lisinopril for about 3 weeks. Please advise

## 2014-03-19 ENCOUNTER — Encounter: Payer: Self-pay | Admitting: Podiatry

## 2014-03-19 ENCOUNTER — Ambulatory Visit (INDEPENDENT_AMBULATORY_CARE_PROVIDER_SITE_OTHER): Payer: Medicare Other | Admitting: Podiatry

## 2014-03-19 VITALS — BP 169/89 | HR 70 | Ht 65.0 in | Wt 166.0 lb

## 2014-03-19 DIAGNOSIS — M79606 Pain in leg, unspecified: Secondary | ICD-10-CM

## 2014-03-19 DIAGNOSIS — M79609 Pain in unspecified limb: Secondary | ICD-10-CM

## 2014-03-19 DIAGNOSIS — B351 Tinea unguium: Secondary | ICD-10-CM

## 2014-03-19 NOTE — Progress Notes (Signed)
SUBJECTIVE:  73 y.o. year old male presents for painful calluses on both feet. He himself trims them down every two weeks and ends up hurting himself.  They are painful to walk. He wants to have a handicap sticker.   OBJECTIVE:  DERMATOLOGIC EXAMINATION:  Nails: Thick dystrophic nails x 10.  Plantar Calluses ball of both feet.  VASCULAR EXAMINATION OF LOWER LIMBS:  Pedal pulses: All pedal pulses are palpable with normal pulsation.  NEUROLOGIC EXAMINATION OF THE LOWER LIMBS:  All epicritic and tactile sensations grossly intact bilateral.  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   ASSESSMENT:  Painful calluses plantar bilateral.  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  Bilateral bunion.  Pain in lower limb.   PLAN:  Reviewed findings. All nails and calluses debrided.  May return in 6 weeks if callus became unbearable.

## 2014-03-19 NOTE — Patient Instructions (Signed)
Seen for hypertrophic nails and painful calluses. All nails and calluses debrided. Return in 6 weeks for painful callus or as needed.

## 2014-04-30 ENCOUNTER — Encounter: Payer: Self-pay | Admitting: Podiatry

## 2014-04-30 ENCOUNTER — Ambulatory Visit (INDEPENDENT_AMBULATORY_CARE_PROVIDER_SITE_OTHER): Payer: Medicare Other | Admitting: Podiatry

## 2014-04-30 VITALS — BP 127/76 | HR 67

## 2014-04-30 DIAGNOSIS — Q828 Other specified congenital malformations of skin: Secondary | ICD-10-CM

## 2014-04-30 DIAGNOSIS — M79606 Pain in leg, unspecified: Secondary | ICD-10-CM

## 2014-04-30 NOTE — Patient Instructions (Signed)
Seen for hypertrophic nails. All nails debrided. Return in 3 months or as needed.  

## 2014-04-30 NOTE — Progress Notes (Signed)
SUBJECTIVE:  73 y.o. year old male presents for painful calluses on both feet. They are painful to walk.   OBJECTIVE:  DERMATOLOGIC EXAMINATION:  Nails: Thick dystrophic nails x 10.  Plantar Calluses ball of both feet.  VASCULAR EXAMINATION OF LOWER LIMBS:  Pedal pulses: All pedal pulses are palpable with normal pulsation.  NEUROLOGIC EXAMINATION OF THE LOWER LIMBS:  All epicritic and tactile sensations grossly intact bilateral.  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   ASSESSMENT:  Painful calluses plantar bilateral.  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  Bilateral bunion.  Pain in lower limb.   PLAN:  All nails and calluses debrided.  May return in 6 weeks if callus became unbearable.

## 2014-06-11 ENCOUNTER — Ambulatory Visit: Payer: Medicare Other | Admitting: Podiatry

## 2014-06-13 ENCOUNTER — Encounter: Payer: Self-pay | Admitting: Podiatry

## 2014-06-13 ENCOUNTER — Ambulatory Visit (INDEPENDENT_AMBULATORY_CARE_PROVIDER_SITE_OTHER): Payer: Medicare Other | Admitting: Podiatry

## 2014-06-13 VITALS — BP 133/75 | HR 69

## 2014-06-13 DIAGNOSIS — Q828 Other specified congenital malformations of skin: Secondary | ICD-10-CM

## 2014-06-13 DIAGNOSIS — M79606 Pain in leg, unspecified: Secondary | ICD-10-CM

## 2014-06-13 DIAGNOSIS — M21969 Unspecified acquired deformity of unspecified lower leg: Secondary | ICD-10-CM

## 2014-06-13 NOTE — Progress Notes (Signed)
SUBJECTIVE:  73 y.o. year old male presents for painful calluses on both feet and request for trimming.   OBJECTIVE:  DERMATOLOGIC EXAMINATION:  Painful plantar Calluses ball of both feet.  VASCULAR EXAMINATION OF LOWER LIMBS:  Pedal pulses: All pedal pulses are palpable with normal pulsation.  NEUROLOGIC EXAMINATION OF THE LOWER LIMBS:  All epicritic and tactile sensations grossly intact bilateral.  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   ASSESSMENT:  Keratoderma plantar bilateral, painful.  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  Bilateral bunion with deformed metatarsal.  Pain in lower limb.   PLAN:  All nails and calluses debrided.  May return in 6 weeks if callus became unbearable.

## 2014-06-13 NOTE — Patient Instructions (Signed)
Seen for painful calluses. All debrided. Return in 6 weeks as needed.

## 2014-06-18 ENCOUNTER — Ambulatory Visit: Payer: Medicare Other | Attending: Internal Medicine | Admitting: Internal Medicine

## 2014-06-18 ENCOUNTER — Encounter: Payer: Self-pay | Admitting: Internal Medicine

## 2014-06-18 VITALS — BP 140/80 | HR 67 | Temp 98.2°F | Resp 18 | Ht 65.0 in | Wt 169.0 lb

## 2014-06-18 DIAGNOSIS — I10 Essential (primary) hypertension: Secondary | ICD-10-CM

## 2014-06-18 DIAGNOSIS — R0981 Nasal congestion: Secondary | ICD-10-CM

## 2014-06-18 DIAGNOSIS — F329 Major depressive disorder, single episode, unspecified: Secondary | ICD-10-CM

## 2014-06-18 DIAGNOSIS — R7301 Impaired fasting glucose: Secondary | ICD-10-CM

## 2014-06-18 DIAGNOSIS — F32A Depression, unspecified: Secondary | ICD-10-CM

## 2014-06-18 DIAGNOSIS — E039 Hypothyroidism, unspecified: Secondary | ICD-10-CM

## 2014-06-18 LAB — COMPLETE METABOLIC PANEL WITH GFR
ALBUMIN: 3.9 g/dL (ref 3.5–5.2)
ALT: 20 U/L (ref 0–53)
AST: 31 U/L (ref 0–37)
Alkaline Phosphatase: 52 U/L (ref 39–117)
BUN: 10 mg/dL (ref 6–23)
CHLORIDE: 105 meq/L (ref 96–112)
CO2: 25 mEq/L (ref 19–32)
CREATININE: 0.94 mg/dL (ref 0.50–1.35)
Calcium: 9.2 mg/dL (ref 8.4–10.5)
GFR, EST NON AFRICAN AMERICAN: 81 mL/min
GFR, Est African American: >89 mL/min
GLUCOSE: 103 mg/dL — AB (ref 70–99)
Potassium: 4.2 mEq/L (ref 3.5–5.3)
Sodium: 139 mEq/L (ref 135–145)
Total Bilirubin: 0.3 mg/dL (ref 0.2–1.2)
Total Protein: 7 g/dL (ref 6.0–8.3)

## 2014-06-18 MED ORDER — SERTRALINE HCL 25 MG PO TABS: 25.0000 mg | ORAL_TABLET | Freq: Every day | ORAL | Status: DC

## 2014-06-18 MED ORDER — FLUTICASONE PROPIONATE 50 MCG/ACT NA SUSP: 2.0000 | Freq: Every day | NASAL | Status: DC

## 2014-06-18 NOTE — Progress Notes (Signed)
MRN: [id number] Name: Jeffrey Grimes  Sex: male Age: 73 y.o. DOB: 04/25/1941  Allergies: Review of patient's allergies indicates no known allergies.  Chief Complaint  Patient presents with  . Follow-up    HPI: Patient is 73 y.o. male who has to of hypertension, impaired fasting glucose, depression, hypothyroidism comes today for followup, as per patient he saw [place] physician and was started on mirtazapine for depression and insomnia as per patient he didn't like the medication and stopped taking, as per patient his sleep is better but still has depression symptoms denies any SI or HI, patient would like to try different medication, today's blood pressure is borderline elevated, denies any headache dizziness chest and shortness of breath does complain of dry cough especially when he lays down, denies any reflux symptoms but does complain of some nasal congestion and postnasal drip.  Past Medical History  Diagnosis Date  . Thyroid disease     Past Surgical History  Procedure Laterality Date  . Colonoscopy      last done at the age ot 57, had total of 3 times done       Medication List       This list is accurate as of: 06/18/14  3:20 PM.  Always use your most recent med list.               bimatoprost 0.01 % Soln  Commonly known as:  LUMIGAN  1 drop at bedtime.     COMBIGAN OP  Apply to eye.     fluticasone 50 MCG/ACT nasal spray  Commonly known as:  FLONASE  Place 2 sprays into both nostrils daily.     levothyroxine 100 MCG tablet  Commonly known as:  SYNTHROID, LEVOTHROID  Take 1 tablet (100 mcg total) by mouth daily.     losartan-hydrochlorothiazide 50-12.5 MG per tablet  Commonly known as:  HYZAAR  Take 1 tablet by mouth daily.     sertraline 25 MG tablet  Commonly known as:  ZOLOFT  Take 1 tablet (25 mg total) by mouth daily.        Meds ordered this encounter  Medications  . bimatoprost (LUMIGAN) 0.01 % SOLN    Sig: 1 drop at bedtime.  .  Brimonidine Tartrate-Timolol (COMBIGAN OP)    Sig: Apply to eye.  . sertraline (ZOLOFT) 25 MG tablet    Sig: Take 1 tablet (25 mg total) by mouth daily.    Dispense:  30 tablet    Refill:  2  . fluticasone (FLONASE) 50 MCG/ACT nasal spray    Sig: Place 2 sprays into both nostrils daily.    Dispense:  16 g    Refill:  3     There is no immunization history on file for this patient.  Family History  Problem Relation Age of Onset  . Diabetes Mother   . Hypertension Mother   . Heart disease Mother   . Hypertension Father   . Heart disease Father   . Thyroid disease Sister   . Hypertension Brother   . Diabetes Brother     History  Substance Use Topics  . Smoking status: Former Research scientist (life sciences)  . Smokeless tobacco: Never Used  . Alcohol Use: 0.0 oz/week    0 Not specified per week     Comment: socially     Review of Systems   As noted in HPI  Filed Vitals:   06/18/14 1512  BP: 140/80  Pulse:   Temp:   Resp:  Physical Exam  Physical Exam  Constitutional: No distress.  HENT:  Nasal congestion no sinus tenderness  Eyes: EOM are normal. Pupils are equal, round, and reactive to light.  Cardiovascular: Normal rate and regular rhythm.   Pulmonary/Chest: Breath sounds normal. No respiratory distress. He has no wheezes. He has no rales.  Musculoskeletal: He exhibits no edema.    CBC    Component Value Date/Time   WBC 3.9* 08/12/2013 1437   RBC 4.48 08/12/2013 1437   HGB 14.1 08/12/2013 1437   HCT 41.3 08/12/2013 1437   PLT 203 08/12/2013 1437   MCV 92.2 08/12/2013 1437   LYMPHSABS 1.9 08/12/2013 1437   MONOABS 0.6 08/12/2013 1437   EOSABS 0.1 08/12/2013 1437   BASOSABS 0.0 08/12/2013 1437    CMP     Component Value Date/Time   NA 138 02/05/2014 1241   K 4.3 02/05/2014 1241   CL 104 02/05/2014 1241   CO2 29 02/05/2014 1241   GLUCOSE 110* 02/05/2014 1241   BUN 10 02/05/2014 1241   CREATININE 0.90 02/05/2014 1241   CALCIUM 9.6 02/05/2014 1241   PROT 7.3  02/05/2014 1241   ALBUMIN 4.0 02/05/2014 1241   AST 21 02/05/2014 1241   ALT 18 02/05/2014 1241   ALKPHOS 49 02/05/2014 1241   BILITOT 0.5 02/05/2014 1241   GFRNONAA 86 02/05/2014 1241   GFRAA >89 02/05/2014 1241    Lab Results  Component Value Date/Time   CHOL 196 08/12/2013 02:37 PM    No components found for: HGA1C  Lab Results  Component Value Date/Time   AST 21 02/05/2014 12:41 PM    Assessment and Plan  Essential hypertension, benign - Plan: blood pressure is borderline elevated, advise patient for DASH diet, continue with Hyzaar, repeat COMPLETE METABOLIC PANEL WITH GFR  IFG (impaired fasting glucose) - Plan: advised patient for low carbohydrate  diet COMPLETE METABOLIC PANEL WITH GFR  Depression - Plan: trial of sertraline (ZOLOFT) 25 MG tablet, advise patient to get immediate medical attention if she has any symptoms of SI or HI, patient understand and verbalized instructions.  Nasal congestion - Plan:trial of  fluticasone (FLONASE) 50 MCG/ACT nasal spray  Hypothyroidism, unspecified hypothyroidism type - Plan:currently patient is on levothyroxine 100 mcg daily, repeat  TSH level    Health Maintenance  -Vaccinations:  -patient declined flu shot  Return in about 3 months (around 09/17/2014) for hypertension, hypothyroid.  [name], MD

## 2014-06-18 NOTE — Progress Notes (Signed)
F/U HTN Complaining dry cough worsen when laying down Stop taking medication for depression (Mertazapine) makings him sick

## 2014-06-19 LAB — TSH: TSH: 3.408 u[IU]/mL (ref 0.350–4.500)

## 2014-07-17 ENCOUNTER — Telehealth: Payer: Self-pay | Admitting: *Deleted

## 2014-07-17 NOTE — Telephone Encounter (Signed)
-----   Message from [name], MD sent at 06/19/2014  9:46 AM EST ----- Call and let the patient know that his TSH level is in normal range continue with the current dose of levothyroxine

## 2014-07-17 NOTE — Telephone Encounter (Signed)
Called pt left VM to return my call.

## 2014-07-22 ENCOUNTER — Ambulatory Visit (INDEPENDENT_AMBULATORY_CARE_PROVIDER_SITE_OTHER): Payer: Medicare Other | Admitting: Internal Medicine

## 2014-07-22 ENCOUNTER — Encounter: Payer: Self-pay | Admitting: Internal Medicine

## 2014-07-22 VITALS — BP 122/76 | HR 73 | Temp 98.1°F | Resp 12 | Ht 65.0 in | Wt 172.4 lb

## 2014-07-22 DIAGNOSIS — F32A Depression, unspecified: Secondary | ICD-10-CM

## 2014-07-22 DIAGNOSIS — I10 Essential (primary) hypertension: Secondary | ICD-10-CM

## 2014-07-22 DIAGNOSIS — E039 Hypothyroidism, unspecified: Secondary | ICD-10-CM

## 2014-07-22 DIAGNOSIS — R7301 Impaired fasting glucose: Secondary | ICD-10-CM

## 2014-07-22 DIAGNOSIS — F329 Major depressive disorder, single episode, unspecified: Secondary | ICD-10-CM

## 2014-07-22 MED ORDER — LEVOTHYROXINE SODIUM 100 MCG PO TABS: 100.0000 ug | ORAL_TABLET | Freq: Every day | ORAL | Status: DC

## 2014-07-22 NOTE — Patient Instructions (Signed)
We have sent in your refill of the thyroid medicine. We will not check your blood today since it was recently checked at the other clinic.   We will see you back in about 6 months to check on your and to check on the blood work.   If you get sick or have any problems or questions please feel free to call the office.   Health Maintenance A healthy lifestyle and preventative care can promote health and wellness.  Maintain regular health, dental, and eye exams.  Eat a healthy diet. Foods like vegetables, fruits, whole grains, low-fat dairy products, and lean protein foods contain the nutrients you need and are low in calories. Decrease your intake of foods high in solid fats, added sugars, and salt. Get information about a proper diet from your health care provider, if necessary.  Regular physical exercise is one of the most important things you can do for your health. Most adults should get at least 150 minutes of moderate-intensity exercise (any activity that increases your heart rate and causes you to sweat) each week. In addition, most adults need muscle-strengthening exercises on 2 or more days a week.   Maintain a healthy weight. The body mass index (BMI) is a screening tool to identify possible weight problems. It provides an estimate of body fat based on height and weight. Your health care provider can find your BMI and can help you achieve or maintain a healthy weight. For males 20 years and older:  A BMI below 18.5 is considered underweight.  A BMI of 18.5 to 24.9 is normal.  A BMI of 25 to 29.9 is considered overweight.  A BMI of 30 and above is considered obese.  Maintain normal blood lipids and cholesterol by exercising and minimizing your intake of saturated fat. Eat a balanced diet with plenty of fruits and vegetables. Blood tests for lipids and cholesterol should begin at age 2 and be repeated every 5 years. If your lipid or cholesterol levels are high, you are over age 78, or  you are at high risk for heart disease, you may need your cholesterol levels checked more frequently.Ongoing high lipid and cholesterol levels should be treated with medicines if diet and exercise are not working.  If you smoke, find out from your health care provider how to quit. If you do not use tobacco, do not start.  Lung cancer screening is recommended for adults aged 74-80 years who are at high risk for developing lung cancer because of a history of smoking. A yearly low-dose CT scan of the lungs is recommended for people who have at least a 30-pack-year history of smoking and are current smokers or have quit within the past 15 years. A pack year of smoking is smoking an average of 1 pack of cigarettes a day for 1 year (for example, a 30-pack-year history of smoking could mean smoking 1 pack a day for 30 years or 2 packs a day for 15 years). Yearly screening should continue until the smoker has stopped smoking for at least 15 years. Yearly screening should be stopped for people who develop a health problem that would prevent them from having lung cancer treatment.  If you choose to drink alcohol, do not have more than 2 drinks per day. One drink is considered to be 12 oz (360 mL) of beer, 5 oz (150 mL) of wine, or 1.5 oz (45 mL) of liquor.  Avoid the use of street drugs. Do not share needles with  anyone. Ask for help if you need support or instructions about stopping the use of drugs.  High blood pressure causes heart disease and increases the risk of stroke. Blood pressure should be checked at least every 1-2 years. Ongoing high blood pressure should be treated with medicines if weight loss and exercise are not effective.  If you are 53-86 years old, ask your health care provider if you should take aspirin to prevent heart disease.  Diabetes screening involves taking a blood sample to check your fasting blood sugar level. This should be done once every 3 years after age 63 if you are at a  normal weight and without risk factors for diabetes. Testing should be considered at a younger age or be carried out more frequently if you are overweight and have at least 1 risk factor for diabetes.  Colorectal cancer can be detected and often prevented. Most routine colorectal cancer screening begins at the age of 51 and continues through age 78. However, your health care provider may recommend screening at an earlier age if you have risk factors for colon cancer. On a yearly basis, your health care provider may provide home test kits to check for hidden blood in the stool. A small camera at the end of a tube may be used to directly examine the colon (sigmoidoscopy or colonoscopy) to detect the earliest forms of colorectal cancer. Talk to your health care provider about this at age 11 when routine screening begins. A direct exam of the colon should be repeated every 5-10 years through age 68, unless early forms of precancerous polyps or small growths are found.  People who are at an increased risk for hepatitis B should be screened for this virus. You are considered at high risk for hepatitis B if:  You were born in a country where hepatitis B occurs often. Talk with your health care provider about which countries are considered high risk.  Your parents were born in a high-risk country and you have not received a shot to protect against hepatitis B (hepatitis B vaccine).  You have HIV or AIDS.  You use needles to inject street drugs.  You live with, or have sex with, someone who has hepatitis B.  You are a man who has sex with other men (MSM).  You get hemodialysis treatment.  You take certain medicines for conditions like cancer, organ transplantation, and autoimmune conditions.  Hepatitis C blood testing is recommended for all people born from [birth year] through 1964 and any individual with known risk factors for hepatitis C.  Healthy men should no longer receive prostate-specific antigen  (PSA) blood tests as part of routine cancer screening. Talk to your health care provider about prostate cancer screening.  Testicular cancer screening is not recommended for adolescents or adult males who have no symptoms. Screening includes self-exam, a health care provider exam, and other screening tests. Consult with your health care provider about any symptoms you have or any concerns you have about testicular cancer.  Practice safe sex. Use condoms and avoid high-risk sexual practices to reduce the spread of sexually transmitted infections (STIs).  You should be screened for STIs, including gonorrhea and chlamydia if:  You are sexually active and are younger than 24 years.  You are older than 24 years, and your health care provider tells you that you are at risk for this type of infection.  Your sexual activity has changed since you were last screened, and you are at an increased risk for  chlamydia or gonorrhea. Ask your health care provider if you are at risk.  If you are at risk of being infected with HIV, it is recommended that you take a prescription medicine daily to prevent HIV infection. This is called pre-exposure prophylaxis (PrEP). You are considered at risk if:  You are a man who has sex with other men (MSM).  You are a heterosexual man who is sexually active with multiple partners.  You take drugs by injection.  You are sexually active with a partner who has HIV.  Talk with your health care provider about whether you are at high risk of being infected with HIV. If you choose to begin PrEP, you should first be tested for HIV. You should then be tested every 3 months for as long as you are taking PrEP.  Use sunscreen. Apply sunscreen liberally and repeatedly throughout the day. You should seek shade when your shadow is shorter than you. Protect yourself by wearing long sleeves, pants, a wide-brimmed hat, and sunglasses year round whenever you are outdoors.  Tell your health  care provider of new moles or changes in moles, especially if there is a change in shape or color. Also, tell your health care provider if a mole is larger than the size of a pencil eraser.  A one-time screening for abdominal aortic aneurysm (AAA) and surgical repair of large AAAs by ultrasound is recommended for men aged 41-75 years who are current or former smokers.  Stay current with your vaccines (immunizations). Document Released: 12/17/2007 Document Revised: 06/25/2013 Document Reviewed: 11/15/2010 [facility] Patient Information 2015 [place], [place]. This information is not intended to replace advice given to you by your health care provider. Make sure you discuss any questions you have with your health care provider.

## 2014-07-22 NOTE — Progress Notes (Signed)
Pre visit review using our clinic review tool, if applicable. No additional management support is needed unless otherwise documented below in the visit note. 

## 2014-07-22 NOTE — Progress Notes (Signed)
   Subjective:    Patient ID: Jeffrey Grimes, male    DOB: 11-14-1940, 74 y.o.   MRN: [id number]  HPI The patient is a 74 YO man who is coming in to establish care. He has PMH of HTN, hypothyroidism, allergies. He is transferring from another clinic since it is hard to get it there for sick visits. He is not having any problems right now and feels fairly good. He denies joint pains, SOB, chest pains, abdominal pain, GERD.   Review of Systems  Constitutional: Negative for fever, activity change, appetite change, fatigue and unexpected weight change.  HENT: Negative.   Respiratory: Negative for cough, chest tightness, shortness of breath and wheezing.   Cardiovascular: Negative for chest pain, palpitations and leg swelling.  Gastrointestinal: Negative for nausea, abdominal pain, diarrhea, constipation and abdominal distention.  Genitourinary: Negative.   Musculoskeletal: Negative for myalgias, back pain and arthralgias.  Skin: Negative.   Neurological: Negative.   Psychiatric/Behavioral: Negative.       Objective:   Physical Exam  Constitutional: He is oriented to person, place, and time. He appears well-developed and well-nourished.  HENT:  Head: Normocephalic and atraumatic.  Eyes: EOM are normal.  Neck: Normal range of motion.  Cardiovascular: Normal rate and regular rhythm.   Pulmonary/Chest: Effort normal and breath sounds normal. No respiratory distress. He has no wheezes. He has no rales.  Abdominal: Soft. Bowel sounds are normal. He exhibits no distension. There is no tenderness. There is no rebound.  Musculoskeletal: He exhibits no edema.  Neurological: He is alert and oriented to person, place, and time. Coordination normal.  Skin: Skin is warm and dry.  Psychiatric: He has a normal mood and affect. His behavior is normal.   Filed Vitals:   07/22/14 1453  BP: 122/76  Pulse: 73  Temp: 98.1 F (36.7 C)  TempSrc: Oral  Resp: 12  Height: 5\' 5"  (1.651 m)  Weight: 172  lb 6.4 oz (78.2 kg)  SpO2: 94%      Assessment & Plan:

## 2014-07-23 NOTE — Assessment & Plan Note (Signed)
Recent TSH normal and no indication for change in therapy.

## 2014-07-23 NOTE — Assessment & Plan Note (Signed)
Did not do well with zoloft and feels that he still struggles with it at times but nothing that stays with him all the time or even most days.

## 2014-07-23 NOTE — Assessment & Plan Note (Signed)
Check HgA1c in 6 months and follow. Encouraged exercise at today's visit.

## 2014-07-23 NOTE — Assessment & Plan Note (Signed)
He is doing well on this medication overall and repeat BP came down (was forgotten to be entered in computer). Recent BMP reviewed and no indication for change in therapy. If BP still elevated next visit will increase therapy.

## 2014-07-25 ENCOUNTER — Ambulatory Visit: Payer: Medicare Other | Admitting: Podiatry

## 2014-07-29 ENCOUNTER — Encounter: Payer: Self-pay | Admitting: Podiatry

## 2014-07-29 ENCOUNTER — Ambulatory Visit (INDEPENDENT_AMBULATORY_CARE_PROVIDER_SITE_OTHER): Payer: Medicare Other | Admitting: Podiatry

## 2014-07-29 VITALS — BP 154/82 | HR 64

## 2014-07-29 DIAGNOSIS — M79606 Pain in leg, unspecified: Secondary | ICD-10-CM

## 2014-07-29 DIAGNOSIS — B351 Tinea unguium: Secondary | ICD-10-CM | POA: Diagnosis not present

## 2014-07-29 NOTE — Patient Instructions (Signed)
Seen for hypertrophic nails. All nails debrided. Return in 3 months or as needed.  

## 2014-07-29 NOTE — Progress Notes (Signed)
SUBJECTIVE:  74 y.o. year old male presents for painful calluses on both feet and request for trimming.  Denies any new changes since last visit.   OBJECTIVE:  DERMATOLOGIC EXAMINATION:  Painful plantar Calluses ball of both feet.  VASCULAR EXAMINATION OF LOWER LIMBS:  Pedal pulses: All pedal pulses are palpable with normal pulsation.  NEUROLOGIC EXAMINATION OF THE LOWER LIMBS:  All epicritic and tactile sensations grossly intact bilateral.  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   ASSESSMENT:  Keratoderma plantar bilateral, painful.  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  Bilateral bunion with deformed metatarsal.  Pain in lower limb.   PLAN:  All nails and calluses debrided.  May return in 6 weeks if callus became unbearable

## 2014-09-15 ENCOUNTER — Ambulatory Visit (INDEPENDENT_AMBULATORY_CARE_PROVIDER_SITE_OTHER): Payer: Medicare Other | Admitting: Internal Medicine

## 2014-09-15 ENCOUNTER — Encounter: Payer: Self-pay | Admitting: Internal Medicine

## 2014-09-15 VITALS — BP 148/90 | HR 67 | Temp 98.2°F | Resp 16 | Wt 172.0 lb

## 2014-09-15 DIAGNOSIS — R239 Unspecified skin changes: Secondary | ICD-10-CM | POA: Diagnosis not present

## 2014-09-15 DIAGNOSIS — R1900 Intra-abdominal and pelvic swelling, mass and lump, unspecified site: Secondary | ICD-10-CM

## 2014-09-15 DIAGNOSIS — R222 Localized swelling, mass and lump, trunk: Secondary | ICD-10-CM

## 2014-09-15 NOTE — Patient Instructions (Signed)
We will check an ultrasound of the stomach to make sure there are no problems with that spot. It will help [place] tell whether it is muscle or blood vessel. If you are not having pain you can try heating pad to see if this helps it to go away.

## 2014-09-15 NOTE — Progress Notes (Signed)
Pre visit review using our clinic review tool, if applicable. No additional management support is needed unless otherwise documented below in the visit note. 

## 2014-09-18 DIAGNOSIS — R239 Unspecified skin changes: Secondary | ICD-10-CM | POA: Insufficient documentation

## 2014-09-18 NOTE — Assessment & Plan Note (Signed)
Lump under the skin on the right abdominal wall. Check ultrasound. Does not appear to be lipoma, could be blood vessel of some kind but size seems inappropriate. If no answers refer to dermatology for biopsy. Does not appear malignant but is growing so will watch closely.

## 2014-09-18 NOTE — Progress Notes (Signed)
   Subjective:    Patient ID: Jeffrey Grimes, male    DOB: 02-Jan-1941, 74 y.o.   MRN: [id number]  HPI The patient is a 74 YO man who is coming in for a lump on his right abdomen. It came up about 2 weeks ago. Denies pain or swelling. No drainage or rash. No fevers or chills. Did not hurt or injure the area. Has not tried anything on it. Has never had anything like it before.   Review of Systems  Constitutional: Negative.   Respiratory: Negative.   Cardiovascular: Negative.   Gastrointestinal: Negative for abdominal pain, constipation and abdominal distention.  Skin:       Lump right abdomen  Neurological: Negative.       Objective:   Physical Exam  Constitutional: He appears well-developed and well-nourished.  Cardiovascular: Normal rate and regular rhythm.   Pulmonary/Chest: Effort normal and breath sounds normal. No respiratory distress. He has no wheezes. He has no rales.  Abdominal: Soft. Bowel sounds are normal. He exhibits no distension. There is no tenderness. There is no rebound and no guarding.  Lump hard, right abdominal wall, not protruding, does not appear to be lipoma, 1 inch long 1/4 inch wide  Skin: Skin is warm and dry. No rash noted. No erythema.  No skin changes on the stomach   Filed Vitals:   09/15/14 1353  BP: 148/90  Pulse: 67  Temp: 98.2 F (36.8 C)  TempSrc: Oral  Resp: 16  Weight: 172 lb (78.019 kg)  SpO2: 97%      Assessment & Plan:

## 2014-10-28 ENCOUNTER — Ambulatory Visit (INDEPENDENT_AMBULATORY_CARE_PROVIDER_SITE_OTHER): Payer: Medicare Other | Admitting: Podiatry

## 2014-10-28 ENCOUNTER — Encounter: Payer: Self-pay | Admitting: Podiatry

## 2014-10-28 VITALS — BP 152/81 | HR 59

## 2014-10-28 DIAGNOSIS — Q828 Other specified congenital malformations of skin: Secondary | ICD-10-CM

## 2014-10-28 DIAGNOSIS — M79606 Pain in leg, unspecified: Secondary | ICD-10-CM

## 2014-10-28 NOTE — Patient Instructions (Signed)
Seen for painful calluses All calluses and nails debrided. Return in 2 months or as needed.

## 2014-10-28 NOTE — Progress Notes (Signed)
SUBJECTIVE:  74 y.o. year old male presents for painful calluses on both feet and request for trimming. Stated that he had to trim in between because of pain.  Also does not want the nails trimmed deep into corners.   OBJECTIVE:  DERMATOLOGIC EXAMINATION:  Painful plantar Calluses ball of both feet.  VASCULAR EXAMINATION OF LOWER LIMBS:  Pedal pulses: All pedal pulses are palpable with normal pulsation.  NEUROLOGIC EXAMINATION OF THE LOWER LIMBS:  All epicritic and tactile sensations grossly intact bilateral.  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   ASSESSMENT:  Keratoderma plantar bilateral, painful.  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  Bilateral bunion with deformed metatarsal.  Pain in lower limb.   PLAN:  All nails and calluses debrided.  May return in 2 months or as needed.

## 2014-11-26 ENCOUNTER — Encounter: Payer: Self-pay | Admitting: Internal Medicine

## 2014-11-26 ENCOUNTER — Ambulatory Visit (INDEPENDENT_AMBULATORY_CARE_PROVIDER_SITE_OTHER): Payer: Medicare Other | Admitting: Internal Medicine

## 2014-11-26 VITALS — BP 128/78 | HR 82 | Temp 98.8°F | Resp 16 | Wt 168.5 lb

## 2014-11-26 DIAGNOSIS — J209 Acute bronchitis, unspecified: Secondary | ICD-10-CM | POA: Diagnosis not present

## 2014-11-26 MED ORDER — AZITHROMYCIN 250 MG PO TABS: ORAL_TABLET | ORAL | Status: DC

## 2014-11-26 MED ORDER — ERYTHROMYCIN 5 MG/GM OP OINT: TOPICAL_OINTMENT | OPHTHALMIC | Status: DC

## 2014-11-26 MED ORDER — HYDROCODONE-HOMATROPINE 5-1.5 MG/5ML PO SYRP: 5.0000 mL | ORAL_SOLUTION | Freq: Three times a day (TID) | ORAL | Status: DC | PRN

## 2014-11-26 NOTE — Patient Instructions (Addendum)
Plain Mucinex (NOT D) for thick secretions ;force NON dairy fluids .   Nasal cleansing in the shower as discussed with lather of mild shampoo.After 10 seconds wash off lather while  exhaling through nostrils. Make sure that all residual soap is removed to prevent irritation.  Flonase OR Nasacort AQ 1 spray in each nostril twice a day as needed. Use the "crossover" technique into opposite nostril spraying toward opposite ear @ 45 degree angle, not straight up into nostril.  Plain Allegra (NOT D )  160 daily , Loratidine 10 mg , OR Zyrtec 10 mg @ bedtime  as needed for itchy eyes & sneezing.  Fill the  prescription for antibiotic if purulent secretions from the eye.

## 2014-11-26 NOTE — Progress Notes (Signed)
Pre visit review using our clinic review tool, if applicable. No additional management support is needed unless otherwise documented below in the visit note. 

## 2014-11-26 NOTE — Progress Notes (Signed)
   Subjective:    Patient ID: Jeffrey Grimes, male    DOB: April 30, 1941, 74 y.o.   MRN: [id number]  HPI His symptoms began 1 week ago as sore throat and loose, nonproductive cough. This sore throat  has resolved as he continues to have a cough which is disturbing sleep. He has associated nasal congestion and frontal sinus pressure. He also describes fatigue and pressure in his ears. He has some itchy, watery eyes.  He's had a fever up to 100.7 as of 5/24. He had associated chills but no sweats.  He describes some crusting of the OS.  Review of Systems He has no facial pain, nasal purulence, significant postnasal drainage, otic discharge, wheezing, or shortness of breath. There's been no visual loss.    Objective:   Physical Exam  General appearance:Adequately nourished; no acute distress or increased work of breathing is present.  Head is shaven. He has a mustache.  Lymphatic: No  lymphadenopathy about the head, neck, or axilla .  Eyes: Mild OS  conjunctival inflammation w/o purulence.No lid edema is present. Arcus present.There is no scleral icterus. Extraocular motion is intact without pain. Vision to confrontation is normal  Ears:  External ear exam shows no significant lesions or deformities.  Right tympanic membrane is dull with a central remote perforation. Wax in the left canal essentially prevents visualization of TM.  Nose:  External nasal examination shows no deformity or inflammation. Nasal mucosa are markedly erythematous without lesions or exudates No septal dislocation or deviation.No obstruction to airflow.   Oral exam: Dental hygiene is good; lips and gums are healthy appearing.There is mild oropharyngeal erythema or exudate .  Neck:  No deformities, thyromegaly, masses, or tenderness noted.   Supple with full range of motion without pain.   Heart:  Normal rate and regular rhythm. S1 and S2 normal without gallop, click, rub or other extra sounds. Grade 8/3-3 over 6  systolic murmur  Lungs:Chest clear to auscultation; no wheezes, rhonchi,rales ,or rubs present.  Extremities:  No cyanosis, edema, or clubbing  noted    Skin: Warm & dry w/o tenting  No significant lesions or rash.       Assessment & Plan:  #1 acute bronchitis  #2 subjective crusting of OS without frank conjunctivitis  See orders

## 2014-12-29 ENCOUNTER — Encounter: Payer: Self-pay | Admitting: Podiatry

## 2014-12-29 ENCOUNTER — Ambulatory Visit (INDEPENDENT_AMBULATORY_CARE_PROVIDER_SITE_OTHER): Payer: Medicare Other | Admitting: Podiatry

## 2014-12-29 VITALS — BP 149/76 | HR 65

## 2014-12-29 DIAGNOSIS — B351 Tinea unguium: Secondary | ICD-10-CM | POA: Diagnosis not present

## 2014-12-29 DIAGNOSIS — M79673 Pain in unspecified foot: Secondary | ICD-10-CM

## 2014-12-29 DIAGNOSIS — M79606 Pain in leg, unspecified: Secondary | ICD-10-CM

## 2014-12-29 NOTE — Patient Instructions (Signed)
Seen for hypertrophic painful calluses. All calluses and nails debrided. Return in 6 weeks or as needed.

## 2014-12-29 NOTE — Progress Notes (Signed)
SUBJECTIVE:  74 y.o. year old male presents for painful calluses on both feet and request for trimming.  Stated that he had to trim in between because of pain.   OBJECTIVE:  DERMATOLOGIC EXAMINATION:  Thick hypertrophic nails x 10. Painful plantar Calluses ball of both feet.  VASCULAR EXAMINATION OF LOWER LIMBS:  Pedal pulses: All pedal pulses are palpable with normal pulsation.  NEUROLOGIC EXAMINATION OF THE LOWER LIMBS:  All epicritic and tactile sensations grossly intact bilateral.  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   ASSESSMENT:  Keratoderma plantar bilateral, painful.  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  Bilateral bunion with deformed metatarsal.  Pain in lower limb.   PLAN:  All nails and calluses debrided.  May return in 6 weeks if the callus pain becomes intolerable.

## 2015-01-20 ENCOUNTER — Ambulatory Visit: Payer: Medicare Other | Admitting: Internal Medicine

## 2015-01-30 ENCOUNTER — Ambulatory Visit (INDEPENDENT_AMBULATORY_CARE_PROVIDER_SITE_OTHER): Payer: Medicare Other | Admitting: Internal Medicine

## 2015-01-30 ENCOUNTER — Encounter: Payer: Self-pay | Admitting: Internal Medicine

## 2015-01-30 VITALS — BP 136/78 | HR 66 | Temp 98.8°F | Resp 12 | Ht 65.0 in | Wt 167.4 lb

## 2015-01-30 DIAGNOSIS — I10 Essential (primary) hypertension: Secondary | ICD-10-CM | POA: Diagnosis not present

## 2015-01-30 NOTE — Patient Instructions (Signed)
Your labs look okay and no changes needed today.   It is okay to try 1/2 pill of the prazosin if you are still feeling tired in another week. This may help less with the night mares though.   Come back in about 6 months for a check up.

## 2015-01-30 NOTE — Progress Notes (Signed)
   Subjective:    Patient ID: Jeffrey Grimes, male    DOB: [birth year], 74 y.o.   MRN: [id number]  HPI The patient is a 74 YO man coming in for follow up on new medication. [facility]. He is feeling a little fatigued since starting this new medicine. He is having less night mares. No dizziness with standing. No syncope. Has not been exercising as much. No new complaints or concerns.  Review of Systems  Constitutional: Negative for fever, activity change, appetite change, fatigue and unexpected weight change.  HENT: Negative.   Respiratory: Negative for cough, chest tightness, shortness of breath and wheezing.   Cardiovascular: Negative for chest pain, palpitations and leg swelling.  Gastrointestinal: Negative for nausea, abdominal pain, diarrhea, constipation and abdominal distention.  Genitourinary: Negative.   Musculoskeletal: Negative for myalgias, back pain and arthralgias.  Skin: Negative.   Neurological: Negative.   Psychiatric/Behavioral: Negative.       Objective:   Physical Exam  Constitutional: He is oriented to person, place, and time. He appears well-developed and well-nourished.  HENT:  Head: Normocephalic and atraumatic.  Eyes: EOM are normal.  Neck: Normal range of motion.  Cardiovascular: Normal rate and regular rhythm.   Pulmonary/Chest: Effort normal and breath sounds normal. No respiratory distress. He has no wheezes. He has no rales.  Abdominal: Soft. Bowel sounds are normal. He exhibits no distension. There is no tenderness. There is no rebound.  Musculoskeletal: He exhibits no edema.  Neurological: He is alert and oriented to person, place, and time. Coordination normal.  Skin: Skin is warm and dry.  Psychiatric: He has a normal mood and affect. His behavior is normal.   Filed Vitals:   01/30/15 1524  BP: 136/78  Pulse: 66  Temp: 98.8 F (37.1 C)  TempSrc: Oral  Resp: 12  Height: 5\' 5"  (1.651 m)  Weight: 167 lb  6.4 oz (75.932 kg)  SpO2: 96%      Assessment & Plan:

## 2015-01-30 NOTE — Progress Notes (Signed)
Pre visit review using our clinic review tool, if applicable. No additional management support is needed unless otherwise documented below in the visit note. 

## 2015-01-30 NOTE — Assessment & Plan Note (Addendum)
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 Okay with continuing. If energy still poor he can start taking 1/2 of the pill at night time.

## 2015-02-09 ENCOUNTER — Ambulatory Visit (INDEPENDENT_AMBULATORY_CARE_PROVIDER_SITE_OTHER): Payer: Medicare Other | Admitting: Podiatry

## 2015-02-09 ENCOUNTER — Encounter: Payer: Self-pay | Admitting: Podiatry

## 2015-02-09 VITALS — BP 124/78 | HR 62

## 2015-02-09 DIAGNOSIS — B351 Tinea unguium: Secondary | ICD-10-CM

## 2015-02-09 DIAGNOSIS — Q828 Other specified congenital malformations of skin: Secondary | ICD-10-CM | POA: Diagnosis not present

## 2015-02-09 DIAGNOSIS — M79606 Pain in leg, unspecified: Secondary | ICD-10-CM | POA: Diagnosis not present

## 2015-02-09 NOTE — Progress Notes (Signed)
SUBJECTIVE:  74 y.o. year old male presents for painful calluses on both feet and request for trimming.   OBJECTIVE:  DERMATOLOGIC EXAMINATION:  Thick hypertrophic nails x 10. Painful plantar Calluses ball of both feet.  VASCULAR EXAMINATION OF LOWER LIMBS:  Pedal pulses: All pedal pulses are palpable with normal pulsation.  NEUROLOGIC EXAMINATION OF THE LOWER LIMBS:  All epicritic and tactile sensations grossly intact bilateral.  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   ASSESSMENT:  Keratoderma plantar bilateral, painful.  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  Bilateral bunion with deformed metatarsal.  Pain in lower limb.   PLAN:  All nails and calluses debrided.  May return in 6 weeks if the callus pain becomes intolerable.

## 2015-02-09 NOTE — Patient Instructions (Signed)
Seen for painful calluses and hypertrophic nails. All nails and calluses debrided. Return in 6 weeks as needed.

## 2015-03-23 ENCOUNTER — Ambulatory Visit (INDEPENDENT_AMBULATORY_CARE_PROVIDER_SITE_OTHER): Payer: Medicare Other | Admitting: Podiatry

## 2015-03-23 ENCOUNTER — Encounter: Payer: Self-pay | Admitting: Podiatry

## 2015-03-23 VITALS — BP 135/75 | HR 54 | Ht 65.0 in | Wt 162.0 lb

## 2015-03-23 DIAGNOSIS — B351 Tinea unguium: Secondary | ICD-10-CM

## 2015-03-23 DIAGNOSIS — M79606 Pain in leg, unspecified: Secondary | ICD-10-CM

## 2015-03-23 NOTE — Progress Notes (Signed)
SUBJECTIVE:  74 y.o. year old male presents stating that calluses and nails on both feet are painful. Patient requests for trimming.   OBJECTIVE:  DERMATOLOGIC EXAMINATION:  Thick hypertrophic nails x 10. Painful plantar Calluses ball of both feet.  VASCULAR EXAMINATION OF LOWER LIMBS:  Pedal pulses: All pedal pulses are palpable with normal pulsation.  NEUROLOGIC EXAMINATION OF THE LOWER LIMBS:  All epicritic and tactile sensations grossly intact bilateral.  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   ASSESSMENT:  Keratoderma plantar bilateral, painful.  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  Bilateral bunion with deformed metatarsal.  Pain in lower limb.   PLAN:  All nails and calluses debrided.  May return in 6 weeks if the callus pain becomes intolerable.

## 2015-03-23 NOTE — Patient Instructions (Signed)
Seen for hypertrophic nails. All nails debrided. Return in 6 weeks or as needed.

## 2015-04-13 ENCOUNTER — Other Ambulatory Visit (INDEPENDENT_AMBULATORY_CARE_PROVIDER_SITE_OTHER): Payer: Medicare Other

## 2015-04-13 ENCOUNTER — Encounter: Payer: Self-pay | Admitting: Family

## 2015-04-13 ENCOUNTER — Ambulatory Visit (INDEPENDENT_AMBULATORY_CARE_PROVIDER_SITE_OTHER)
Admission: RE | Admit: 2015-04-13 | Discharge: 2015-04-13 | Disposition: A | Discharge status: Home / Self Care | Payer: Medicare Other | Source: Ambulatory Visit | Attending: Family | Admitting: Family

## 2015-04-13 ENCOUNTER — Ambulatory Visit (INDEPENDENT_AMBULATORY_CARE_PROVIDER_SITE_OTHER): Payer: Medicare Other | Admitting: Family

## 2015-04-13 VITALS — BP 160/90 | HR 67 | Temp 97.7°F | Resp 18 | Ht 65.0 in | Wt 162.8 lb

## 2015-04-13 DIAGNOSIS — Z23 Encounter for immunization: Secondary | ICD-10-CM | POA: Diagnosis not present

## 2015-04-13 DIAGNOSIS — K5900 Constipation, unspecified: Secondary | ICD-10-CM

## 2015-04-13 DIAGNOSIS — R1032 Left lower quadrant pain: Secondary | ICD-10-CM

## 2015-04-13 LAB — TSH: TSH: 1.64 u[IU]/mL (ref 0.35–4.50)

## 2015-04-13 NOTE — Progress Notes (Signed)
Subjective:    Patient ID: Jeffrey Grimes, male    DOB: [birth year], 74 y.o.   MRN: [id number]  Chief Complaint  Patient presents with  . Constipation    x1 month, has been having issues with having BMs, states that he has to take OTC medication in order to go but even when he does the movements are small, worried about a blockage    HPI:  Jeffrey Grimes is a 74 y.o. male who  has a past medical history of Thyroid disease. and presents today for an acute office visit.  Constipation - Associated symptom of constipation has been going on for about 1 month and has been refractory to the modifying factors of OTC medications including Kionex, Senna natural supplements and has tried Miralax in the past. These products produce small bowel movements.  Has a bowel movement everyday when he takes these products and does not when he does not take any of these products. Bowel movements are described as soft secondary to supplements. Does have a small amount of LLQ soreness that is practically gone. Denies blood in his stool.   No Known Allergies   Current Outpatient Prescriptions on File Prior to Visit  Medication Sig Dispense Refill  . bimatoprost (LUMIGAN) 0.01 % SOLN Place 1 drop into both eyes at bedtime.     . COMBIGAN 0.2-0.5 % ophthalmic solution     . levothyroxine (SYNTHROID, LEVOTHROID) 100 MCG tablet Take 1 tablet (100 mcg total) by mouth daily. 90 tablet 3  . losartan-hydrochlorothiazide (HYZAAR) 50-12.5 MG per tablet Take 1 tablet by mouth daily. 90 tablet 3   No current facility-administered medications on file prior to visit.     Past Surgical History  Procedure Laterality Date  . Colonoscopy      last done at the age ot 89, had total of 3 times done     Review of Systems  Constitutional: Negative for fever and chills.  Gastrointestinal: Positive for constipation. Negative for nausea, vomiting, abdominal pain, diarrhea and blood in stool.      Objective:    BP 160/90  mmHg  Pulse 67  Temp(Src) 97.7 F (36.5 C) (Oral)  Resp 18  Ht 5\' 5"  (1.651 m)  Wt 162 lb 12.8 oz (73.846 kg)  BMI 27.09 kg/m2  SpO2 96% Nursing note and vital signs reviewed.  Physical Exam  Constitutional: He is oriented to person, place, and time. He appears well-developed and well-nourished. No distress.  Cardiovascular: Normal rate, regular rhythm, normal heart sounds and intact distal pulses.   Pulmonary/Chest: Effort normal and breath sounds normal.  Abdominal: Soft. Normal appearance and bowel sounds are normal. He exhibits no mass. There is no hepatosplenomegaly. There is tenderness in the left lower quadrant. There is no rigidity, no rebound, no guarding, no tenderness at McBurney's point and negative Murphy's sign.  Neurological: He is alert and oriented to person, place, and time.  Skin: Skin is warm and dry.  Psychiatric: He has a normal mood and affect. His behavior is normal. Judgment and thought content normal.       Assessment & Plan:   Problem List Items Addressed This Visit      Digestive   Constipation - Primary    Symptoms and exam consistent with constipation that is relieved through over-the-counter medications. Discussed importance of increasing fluids, exercise, and increased fiber intake. Recommend Dulcolax as needed. Start a probiotic. Obtain x-rays to rule out bowel obstruction, although unlikely. Obtain TSH to rule out hypothyroidism as  cause. Follow-up pending x-ray results and trial of medication.      Relevant Orders   DG Abd 2 Views (Completed)   TSH (Completed)

## 2015-04-13 NOTE — Progress Notes (Signed)
Pre visit review using our clinic review tool, if applicable. No additional management support is needed unless otherwise documented below in the visit note. 

## 2015-04-13 NOTE — Assessment & Plan Note (Addendum)
Symptoms and exam consistent with constipation that is relieved through over-the-counter medications. Discussed importance of increasing fluids, exercise, and increased fiber intake. Recommend Dulcolax as needed. Start a probiotic. Obtain x-rays to rule out bowel obstruction, although unlikely. Obtain TSH to rule out hypothyroidism as cause. Follow-up pending x-ray results and trial of medication.

## 2015-04-13 NOTE — Patient Instructions (Signed)
Thank you for choosing [facility].  Summary/Instructions:  Please try Ducolax Laxatives or Mirlax.   Please stop by the lab on the basement level of the building for your blood work. Your results will be released to [place] (or called to you) after review, usually within 72 hours after test completion. If any changes need to be made, you will be notified at that same time.  Please stop by radiology on the basement level of the building for your x-rays. Your results will be released to [place] (or called to you) after review, usually within 72 hours after test completion. If any treatments or changes are necessary, you will be notified at that same time.  If your symptoms worsen or fail to improve, please contact our office for further instruction, or in case of emergency go directly to the emergency room at the closest medical facility.    Constipation, Adult Constipation is when a person has fewer than three bowel movements a week, has difficulty having a bowel movement, or has stools that are dry, hard, or larger than normal. As people grow older, constipation is more common. A low-fiber diet, not taking in enough fluids, and taking certain medicines may make constipation worse.  CAUSES   Certain medicines, such as antidepressants, pain medicine, iron supplements, antacids, and water pills.   Certain diseases, such as diabetes, irritable bowel syndrome (IBS), thyroid disease, or depression.   Not drinking enough water.   Not eating enough fiber-rich foods.   Stress or travel.   Lack of physical activity or exercise.   Ignoring the urge to have a bowel movement.   Using laxatives too much.  SIGNS AND SYMPTOMS   Having fewer than three bowel movements a week.   Straining to have a bowel movement.   Having stools that are hard, dry, or larger than normal.   Feeling full or bloated.   Pain in the lower abdomen.   Not feeling relief after having a bowel  movement.  DIAGNOSIS  Your health care provider will take a medical history and perform a physical exam. Further testing may be done for severe constipation. Some tests may include:  A barium enema X-ray to examine your rectum, colon, and, sometimes, your small intestine.   A sigmoidoscopy to examine your lower colon.   A colonoscopy to examine your entire colon. TREATMENT  Treatment will depend on the severity of your constipation and what is causing it. Some dietary treatments include drinking more fluids and eating more fiber-rich foods. Lifestyle treatments may include regular exercise. If these diet and lifestyle recommendations do not help, your health care provider may recommend taking over-the-counter laxative medicines to help you have bowel movements. Prescription medicines may be prescribed if over-the-counter medicines do not work.  HOME CARE INSTRUCTIONS   Eat foods that have a lot of fiber, such as fruits, vegetables, whole grains, and beans.  Limit foods high in fat and processed sugars, such as french fries, hamburgers, cookies, candies, and soda.   A fiber supplement may be added to your diet if you cannot get enough fiber from foods.   Drink enough fluids to keep your urine clear or pale yellow.   Exercise regularly or as directed by your health care provider.   Go to the restroom when you have the urge to go. Do not hold it.   Only take over-the-counter or prescription medicines as directed by your health care provider. Do not take other medicines for constipation without talking to your  health care provider first.  Ava IF:   You have bright red blood in your stool.   Your constipation lasts for more than 4 days or gets worse.   You have abdominal or rectal pain.   You have thin, pencil-like stools.   You have unexplained weight loss. MAKE SURE YOU:   Understand these instructions.  Will watch your condition.  Will get  help right away if you are not doing well or get worse.   This information is not intended to replace advice given to you by your health care provider. Make sure you discuss any questions you have with your health care provider.   Document Released: 03/18/2004 Document Revised: 07/11/2014 Document Reviewed: 04/01/2013 Elsevier Interactive Patient Education [employer].

## 2015-04-15 ENCOUNTER — Telehealth: Payer: Self-pay

## 2015-04-15 DIAGNOSIS — K5900 Constipation, unspecified: Secondary | ICD-10-CM

## 2015-04-15 MED ORDER — LUBIPROSTONE 24 MCG PO CAPS: 24.0000 ug | ORAL_CAPSULE | Freq: Two times a day (BID) | ORAL | Status: DC

## 2015-04-15 NOTE — Telephone Encounter (Signed)
I have sent in a prescription for Amitiza to his pharmacy in addition to the referral to GI.

## 2015-04-15 NOTE — Telephone Encounter (Signed)
Pt called in to get clarification lab results that were sent by my chart. I went over results with him. He would like a referral to GI placed. He states that Dulcolax has not been helping and that was recommended by Marya Amsler to try. He does not want to keep going long periods of time without a BM. Placing a referral for GI.

## 2015-04-16 ENCOUNTER — Encounter: Payer: Self-pay | Admitting: Internal Medicine

## 2015-04-16 NOTE — Telephone Encounter (Signed)
Pt aware.

## 2015-04-28 ENCOUNTER — Encounter: Payer: Self-pay | Admitting: *Deleted

## 2015-05-04 ENCOUNTER — Encounter: Payer: Self-pay | Admitting: Internal Medicine

## 2015-05-08 NOTE — Telephone Encounter (Signed)
Patient states he needs to know what he can do until he can get in with GI in December.  States he has not had a bowel movement is 4 days.  States he has tried laxatives.

## 2015-05-08 NOTE — Telephone Encounter (Signed)
Have sent him instructions earlier this week. He can try magnesium citrate liquid for the bowel movements. As previously instructed if not able to pass gas or keep down liquids or food go to the emergency room.

## 2015-05-11 ENCOUNTER — Encounter: Payer: Self-pay | Admitting: Internal Medicine

## 2015-05-11 ENCOUNTER — Telehealth: Payer: Self-pay | Admitting: Internal Medicine

## 2015-05-11 NOTE — Telephone Encounter (Signed)
Patient requests you call. He is upset and states that someone called him today regarding his bowel movements, but there are no notes in the system that i can find. Please give the patient a call.

## 2015-05-11 NOTE — Telephone Encounter (Signed)
Patient stated that he needs a sooner GI appointment because he can have a bowel movement with help but if he takes an aid then he is at home and can't go out.  I sent him to GI to see if he could get in sooner.

## 2015-05-11 NOTE — Telephone Encounter (Signed)
A user error has taken place.

## 2015-05-11 NOTE — Telephone Encounter (Signed)
Appointment scheduled with Dr. Havery Moros on 05/14/15

## 2015-05-14 ENCOUNTER — Encounter: Payer: Self-pay | Admitting: Gastroenterology

## 2015-05-14 ENCOUNTER — Ambulatory Visit (INDEPENDENT_AMBULATORY_CARE_PROVIDER_SITE_OTHER): Payer: Medicare Other | Admitting: Gastroenterology

## 2015-05-14 VITALS — BP 138/80 | HR 72 | Ht 64.5 in | Wt 162.1 lb

## 2015-05-14 DIAGNOSIS — K5900 Constipation, unspecified: Secondary | ICD-10-CM | POA: Diagnosis not present

## 2015-05-14 DIAGNOSIS — R634 Abnormal weight loss: Secondary | ICD-10-CM | POA: Diagnosis not present

## 2015-05-14 MED ORDER — POLYETHYLENE GLYCOL 3350 17 GM/SCOOP PO POWD: ORAL | Status: DC

## 2015-05-14 MED ORDER — NA SULFATE-K SULFATE-MG SULF 17.5-3.13-1.6 GM/177ML PO SOLN: ORAL | Status: DC

## 2015-05-14 NOTE — Patient Instructions (Signed)
You have been scheduled for a colonoscopy. Please follow written instructions given to you at your visit today.  Please pick up your prep supplies at the pharmacy within the next 1-3 days. If you use inhalers (even only as needed), please bring them with you on the day of your procedure.   

## 2015-05-14 NOTE — Progress Notes (Signed)
HPI :  74 y/o male seen in consultation for constipation for Dr. Pricilla Holm.   Patient reports he has had severe constipation if he does not take anything, which is a new problem for him. He thinks it has been ongoing for a month or so. Prior to one month ago he had one BM per day and was very regular. However one month ago he has had a dramatic change in his stool frequency. He has not seen any blood in the stools. He has lost some weight, he thinks about 10 lbs over the past several months, not trying to lose weight. He previously had some left sided abdominal pain but it has since gone away. He reports he strains significantly and feels as though he can't get stool out.   He states he has had a prior colonoscopy, last done 6 years ago. He reports he has had some polyps previously that were removed, we don't have the results available today. His brother had a colon resection of unclear etiology, he is not sure if he had colon cancer.   He is currently on Amitiza 78mcg q day at present time but he thinks it causes signficant fatigue and does not like the way it makes him feel. He reports this does help him have a bowel movement although he can't leave the house when he takes it, as he is unsure when he will have a bowel movement.   He has been on teas, dulcolax, and other OTC remedies which has not helped too much. He has tried milk of magnesia. He has been on Miralax powder which did not help, but took only one scoop per day and did not try higher dosing.    Past Medical History  Diagnosis Date  . Thyroid disease   . Depression   . Glaucoma   . Anxiety   . Constipation   . Heart murmur   . Colon polyps      Past Surgical History  Procedure Laterality Date  . Colonoscopy      done in [place] 2010   Family History  Problem Relation Age of Onset  . Diabetes Mother   . Hypertension Mother   . Heart disease Mother   . Hypertension Father   . Heart disease Father   .  Thyroid disease Sister   . Hypertension Brother   . Diabetes Brother   . Prostate cancer Brother    Social History  Substance Use Topics  . Smoking status: Former Research scientist (life sciences)  . Smokeless tobacco: Never Used  . Alcohol Use: 0.0 oz/week    0 Standard drinks or equivalent per week     Comment: socially    Current Outpatient Prescriptions  Medication Sig Dispense Refill  . bimatoprost (LUMIGAN) 0.01 % SOLN Place 1 drop into both eyes at bedtime.     . COMBIGAN 0.2-0.5 % ophthalmic solution     . levothyroxine (SYNTHROID, LEVOTHROID) 100 MCG tablet Take 1 tablet (100 mcg total) by mouth daily. 90 tablet 3  . losartan-hydrochlorothiazide (HYZAAR) 50-12.5 MG per tablet Take 1 tablet by mouth daily. 90 tablet 3  . lubiprostone (AMITIZA) 24 MCG capsule Take 1 capsule (24 mcg total) by mouth 2 (two) times daily with a meal. 60 capsule 0   No current facility-administered medications for this visit.   No Known Allergies   Review of Systems: All systems reviewed and negative except where noted in HPI.   Lab Results  Component Value Date  WBC 3.9* 08/12/2013   HGB 14.1 08/12/2013   HCT 41.3 08/12/2013   MCV 92.2 08/12/2013   PLT 203 08/12/2013    Lab Results  Component Value Date   ALT 20 06/18/2014   AST 31 06/18/2014   ALKPHOS 52 06/18/2014   BILITOT 0.3 06/18/2014     Physical Exam: BP 150/74 mmHg  Pulse 72  Ht 5' 4.5" (1.638 m)  Wt 162 lb 2 oz (73.539 kg)  BMI 27.41 kg/m2 Constitutional: Pleasant,well-developed, male in no acute distress. HEENT: Normocephalic and atraumatic. Conjunctivae are normal. No scleral icterus. Neck supple.  Cardiovascular: Normal rate, regular rhythm.  Pulmonary/chest: Effort normal and breath sounds normal. No wheezing, rales or rhonchi. Abdominal: Soft, nondistended, nontender. Bowel sounds active throughout. There are no masses palpable. No hepatomegaly. Extremities: no edema Lymphadenopathy: No cervical adenopathy noted. Neurological:  Alert and oriented to person place and time. Skin: Skin is warm and dry. No rashes noted. Psychiatric: Normal mood and affect. Behavior is normal.   ASSESSMENT AND PLAN: 74 y/o male with a reported history of colon polyps (details not known) presenting with new onset severe constipation starting one month ago. He has had some mild weight loss and some intermittent LLQ pain. He has had some benefit with Amitiza but doesn't like how it makes him feel. Recommend he stop this and can try high dose miralax as he previously only used low dose miralax. He can titrate this to effect. Otherwise, recommend a colonoscopy given his abrupt change in symptoms, and last colonoscopy > 5 years ago.   The indications, risks, and benefits of colonoscopy were explained to the patient in detail. Risks include but are not limited to bleeding, perforation, adverse reaction to medications, and cardiopulmonary compromise. Sequelae include but are not limited to the possibility of surgery, hospitalization, and mortality. The patient verbalized understanding and wished to proceed. All questions answered, referred to the scheduler and bowel prep ordered. Further recommendations pending results of the exam.   [place] Cellar, MD [facility] Pager [phone]  CC: Dr. Pricilla Holm

## 2015-05-25 ENCOUNTER — Encounter (HOSPITAL_COMMUNITY): Payer: Self-pay | Admitting: Emergency Medicine

## 2015-05-25 ENCOUNTER — Emergency Department (HOSPITAL_COMMUNITY)
Admission: EM | Admit: 2015-05-25 | Discharge: 2015-05-25 | Disposition: A | Discharge status: Home / Self Care | Payer: Medicare Other | Attending: Emergency Medicine | Admitting: Emergency Medicine

## 2015-05-25 ENCOUNTER — Telehealth: Payer: Self-pay | Admitting: Gastroenterology

## 2015-05-25 ENCOUNTER — Emergency Department (HOSPITAL_COMMUNITY): Payer: Medicare Other

## 2015-05-25 DIAGNOSIS — Z86010 Personal history of colonic polyps: Secondary | ICD-10-CM | POA: Diagnosis not present

## 2015-05-25 DIAGNOSIS — F419 Anxiety disorder, unspecified: Secondary | ICD-10-CM | POA: Insufficient documentation

## 2015-05-25 DIAGNOSIS — Z79899 Other long term (current) drug therapy: Secondary | ICD-10-CM | POA: Diagnosis not present

## 2015-05-25 DIAGNOSIS — K5900 Constipation, unspecified: Secondary | ICD-10-CM | POA: Diagnosis present

## 2015-05-25 DIAGNOSIS — E079 Disorder of thyroid, unspecified: Secondary | ICD-10-CM | POA: Insufficient documentation

## 2015-05-25 DIAGNOSIS — Z8659 Personal history of other mental and behavioral disorders: Secondary | ICD-10-CM | POA: Insufficient documentation

## 2015-05-25 DIAGNOSIS — H409 Unspecified glaucoma: Secondary | ICD-10-CM | POA: Diagnosis not present

## 2015-05-25 DIAGNOSIS — Z87891 Personal history of nicotine dependence: Secondary | ICD-10-CM | POA: Diagnosis not present

## 2015-05-25 DIAGNOSIS — R011 Cardiac murmur, unspecified: Secondary | ICD-10-CM | POA: Insufficient documentation

## 2015-05-25 HISTORY — DX: Essential (primary) hypertension: I10

## 2015-05-25 LAB — CBC WITH DIFFERENTIAL/PLATELET
BASOS ABS: 0 10*3/uL (ref 0.0–0.1)
BASOS PCT: 0 %
Eosinophils Absolute: 0.1 10*3/uL (ref 0.0–0.7)
Eosinophils Relative: 4 %
HEMATOCRIT: 42.9 % (ref 39.0–52.0)
HEMOGLOBIN: 13.9 g/dL (ref 13.0–17.0)
LYMPHS PCT: 54 %
Lymphs Abs: 1.8 10*3/uL (ref 0.7–4.0)
MCH: 30.9 pg (ref 26.0–34.0)
MCHC: 32.4 g/dL (ref 30.0–36.0)
MCV: 95.3 fL (ref 78.0–100.0)
Monocytes Absolute: 0.3 10*3/uL (ref 0.1–1.0)
Monocytes Relative: 11 %
NEUTROS ABS: 1 10*3/uL — AB (ref 1.7–7.7)
NEUTROS PCT: 31 %
Platelets: 162 10*3/uL (ref 150–400)
RBC: 4.5 MIL/uL (ref 4.22–5.81)
RDW: 15.6 % — ABNORMAL HIGH (ref 11.5–15.5)
WBC: 3.2 10*3/uL — AB (ref 4.0–10.5)

## 2015-05-25 LAB — COMPREHENSIVE METABOLIC PANEL
ALBUMIN: 3.4 g/dL — AB (ref 3.5–5.0)
ALT: 14 U/L — ABNORMAL LOW (ref 17–63)
ANION GAP: 8 (ref 5–15)
AST: 23 U/L (ref 15–41)
Alkaline Phosphatase: 50 U/L (ref 38–126)
BILIRUBIN TOTAL: 0.4 mg/dL (ref 0.3–1.2)
BUN: 11 mg/dL (ref 6–20)
CO2: 24 mmol/L (ref 22–32)
Calcium: 9 mg/dL (ref 8.9–10.3)
Chloride: 105 mmol/L (ref 101–111)
Creatinine, Ser: 1.11 mg/dL (ref 0.61–1.24)
GLUCOSE: 183 mg/dL — AB (ref 65–99)
POTASSIUM: 3.9 mmol/L (ref 3.5–5.1)
Sodium: 137 mmol/L (ref 135–145)
TOTAL PROTEIN: 7.2 g/dL (ref 6.5–8.1)

## 2015-05-25 MED ORDER — LINACLOTIDE 145 MCG PO CAPS: 145.0000 ug | ORAL_CAPSULE | Freq: Every day | ORAL | Status: DC

## 2015-05-25 MED ORDER — SODIUM CHLORIDE 0.9 % IV BOLUS (SEPSIS)
1000.0000 mL | Freq: Once | INTRAVENOUS | Status: AC
  Administered 2015-05-25: 1000 mL via INTRAVENOUS

## 2015-05-25 MED ORDER — IOHEXOL 300 MG/ML  SOLN
100.0000 mL | Freq: Once | INTRAMUSCULAR | Status: AC | PRN
  Administered 2015-05-25: 100 mL via INTRAVENOUS

## 2015-05-25 MED ORDER — BARIUM SULFATE 2.1 % PO SUSP
ORAL | Status: AC
  Filled 2015-05-25: qty 1

## 2015-05-25 NOTE — ED Notes (Addendum)
Pt reports intermittent constipation x 2 months. PT has been prescribed medication for the same. Pt reports the Miralax does not work, but a different pill prescribed to him makes him go to much. Pt reports last BM yesterday. NAD at this time. Pt alert x4.

## 2015-05-25 NOTE — ED Notes (Signed)
PA at the bedside.

## 2015-05-25 NOTE — ED Provider Notes (Signed)
CSN: [id number]     Arrival date & time 05/25/15  0730 History   First MD Initiated Contact with Patient 05/25/15 0740     Chief Complaint  Patient presents with  . Constipation     (Consider location/radiation/quality/duration/timing/severity/associated sxs/prior Treatment) HPI     Jeffrey Grimes 74 year old male with a past medical history of thyroid disease, depression, and intermittent bouts of constipation. Patient states that for the past 2 months he has had significant constipation. He has been seen by both his primary care physician as well as a gastroenterologist. The patient was prescribed MiraLAX as well as Amitiza. The patient states that he is having difficulty tolerating Amitiza. Because it makes him feel nauseated and he sometimes has stool incontinence after using it. He also feels that the MiraLAX is not helping. Patient states that he used one of his pills yesterday and had 3 large liquid stools. He is concerned because he is afraid he may have abdominal cancer and requests CT of the abdomen. He states "I used to have regular bowel movements daily and suddenly it stopped 2 months ago. " He denies nausea or vomiting. He denies fever, chills or urinary symptoms. He denies any changes in stool caliber or blood in stools.  Past Medical History  Diagnosis Date  . Thyroid disease   . Depression   . Glaucoma   . Anxiety   . Constipation   . Heart murmur   . Colon polyps    Past Surgical History  Procedure Laterality Date  . Colonoscopy      done in [place] 2010   Family History  Problem Relation Age of Onset  . Diabetes Mother   . Hypertension Mother   . Heart disease Mother   . Hypertension Father   . Heart disease Father   . Thyroid disease Sister   . Hypertension Brother   . Diabetes Brother   . Prostate cancer Brother    Social History  Substance Use Topics  . Smoking status: Former Research scientist (life sciences)  . Smokeless tobacco: Never Used  . Alcohol Use: 0.0 oz/week    0 Standard drinks or equivalent per week     Comment: socially     Review of Systems  Ten systems reviewed and are negative for acute change, except as noted in the HPI.    Allergies  Review of patient's allergies indicates no known allergies.  Home Medications   Prior to Admission medications   Medication Sig Start Date End Date Taking? Authorizing Provider  bimatoprost (LUMIGAN) 0.01 % SOLN Place 1 drop into both eyes at bedtime.     Historical Provider, MD  COMBIGAN 0.2-0.5 % ophthalmic solution  07/16/14   Historical Provider, MD  levothyroxine (SYNTHROID, LEVOTHROID) 100 MCG tablet Take 1 tablet (100 mcg total) by mouth daily. 07/22/14   Hoyt Koch, MD  losartan-hydrochlorothiazide (HYZAAR) 50-12.5 MG per tablet Take 1 tablet by mouth daily. 02/19/14   [name], MD  lubiprostone (AMITIZA) 24 MCG capsule Take 1 capsule (24 mcg total) by mouth 2 (two) times daily with a meal. 04/15/15   Golden Circle, FNP  Na Sulfate-K Sulfate-Mg Sulf SOLN Take as directed per Colonoscopy. 05/14/15   Manus Gunning, MD  polyethylene glycol powder [facility]) powder Take 17grams BID and once constipation is regulated take as needed. 05/14/15   Manus Gunning, MD   BP 128/70 mmHg  Pulse 62  Temp(Src) 98 F (36.7 C) (Oral)  Resp 18  SpO2 98% Physical Exam  Nursing note and vitals reviewed. Constitutional: He appears well-developed and well-nourished. No distress.  HENT:  Head: Normocephalic and atraumatic.  Eyes: Conjunctivae normal are normal. No scleral icterus.  Neck: Normal range of motion. Neck supple.  Cardiovascular: Normal rate, regular rhythm and normal heart sounds.   Pulmonary/Chest: Effort normal and breath sounds normal. No respiratory distress.  Abdominal: Soft. There is no tenderness.  Musculoskeletal: He exhibits no edema.  Neurological: He is alert.  Skin: Skin is warm and dry. He is not diaphoretic. GU: Digital Rectal Exam reveals  sphincter with good tone. No external hemorrhoids. No masses or fissures. Stool color is brown with no overt blood.  Psychiatric: His behavior is normal.    ED Course  Procedures (including critical care time) Labs Review Labs Reviewed  COMPREHENSIVE METABOLIC PANEL  CBC WITH DIFFERENTIAL/PLATELET    Imaging Review No results found. I have personally reviewed and evaluated these images and lab results as part of my medical decision-making.   EKG Interpretation None      MDM   Final diagnoses:  None      BP 128/70 mmHg  Pulse 62  Temp(Src) 98 F (36.7 C) (Oral)  Resp 18  SpO2 98% Patient with labs, reassuring. Benign abdominal exam. CT negative   Patient is nontoxic, nonseptic appearing, in no apparent distress.  Patient's pain and other symptoms adequately managed in emergency department.  Fluid bolus given.  Labs, imaging and vitals reviewed.  Patient does not meet the SIRS or Sepsis criteria.  On repeat exam patient does not have a surgical abdomin and there are no peritoneal signs.  No indication of appendicitis, bowel obstruction, bowel perforation, cholecystitis, diverticulitis.  Patient discharged home with symptomatic treatment and given strict instructions for follow-up with their primary care physician.  I have also discussed reasons to return immediately to the ER.  Patient expresses understanding and agrees with plan.       Margarita Mail, PA-C 05/25/15 1502  Leo Grosser, MD 05/26/15 [phone]

## 2015-05-25 NOTE — ED Notes (Signed)
Patient taken to CT.

## 2015-05-25 NOTE — Telephone Encounter (Signed)
Spoke with patient and he states he has been having constipation x 2 months. He has tried Miralax BID like Dr. Havery Moros suggested. He went for 4 days without a bowel movement and he decided to take the Amitiza. He had a bowel movement but the Amitiza made him sick. States he went to ED this AM and had a CT scan which was negative. He states he needs help for the constipation. He is scheduled for a colonoscopy in January. Please, advise.

## 2015-05-25 NOTE — Discharge Instructions (Signed)
High-Fiber Diet Fiber, also called dietary fiber, is a type of carbohydrate found in fruits, vegetables, whole grains, and beans. A high-fiber diet can have many health benefits. Your health care provider may recommend a high-fiber diet to help:  Prevent constipation. Fiber can make your bowel movements more regular.  Lower your cholesterol.  Relieve hemorrhoids, uncomplicated diverticulosis, or irritable bowel syndrome.  Prevent overeating as part of a weight-loss plan.  Prevent heart disease, type 2 diabetes, and certain cancers. WHAT IS MY PLAN? The recommended daily intake of fiber includes:  38 grams for men under age 69.  28 grams for men over age 42.  16 grams for women under age 71.  86 grams for women over age 75. You can get the recommended daily intake of dietary fiber by eating a variety of fruits, vegetables, grains, and beans. Your health care provider may also recommend a fiber supplement if it is not possible to get enough fiber through your diet. WHAT DO I NEED TO KNOW ABOUT A HIGH-FIBER DIET?  Fiber supplements have not been widely studied for their effectiveness, so it is better to get fiber through food sources.  Always check the fiber content on thenutrition facts label of any prepackaged food. Look for foods that contain at least 5 grams of fiber per serving.  Ask your dietitian if you have questions about specific foods that are related to your condition, especially if those foods are not listed in the following section.  Increase your daily fiber consumption gradually. Increasing your intake of dietary fiber too quickly may cause bloating, cramping, or gas.  Drink plenty of water. Water helps you to digest fiber. WHAT FOODS CAN I EAT? Grains Whole-grain breads. Multigrain cereal. Oats and oatmeal. Brown rice. Barley. Bulgur wheat. [place]. Bran muffins. Popcorn. Rye wafer crackers. Vegetables Sweet potatoes. Spinach. Kale. Artichokes. Cabbage. Broccoli.  Green peas. Carrots. Squash. Fruits Berries. Pears. Apples. Oranges. Avocados. Prunes and raisins. Dried figs. Meats and Other Protein Sources [place], kidney, pinto, and soy beans. Split peas. Lentils. Nuts and seeds. Dairy Fiber-fortified yogurt. Beverages Fiber-fortified soy milk. Fiber-fortified orange juice. Other Fiber bars. The items listed above may not be a complete list of recommended foods or beverages. Contact your dietitian for more options. WHAT FOODS ARE NOT RECOMMENDED? Grains White bread. Pasta made with refined flour. White rice. Vegetables Fried potatoes. Canned vegetables. Well-cooked vegetables.  Fruits Fruit juice. Cooked, strained fruit. Meats and Other Protein Sources Fatty cuts of meat. Fried Sales executive or fried fish. Dairy Milk. Yogurt. Cream cheese. Sour cream. Beverages Soft drinks. Other Cakes and pastries. Butter and oils. The items listed above may not be a complete list of foods and beverages to avoid. Contact your dietitian for more information. WHAT ARE SOME TIPS FOR INCLUDING HIGH-FIBER FOODS IN MY DIET?  Eat a wide variety of high-fiber foods.  Make sure that half of all grains consumed each day are whole grains.  Replace breads and cereals made from refined flour or white flour with whole-grain breads and cereals.  Replace white rice with brown rice, bulgur wheat, or millet.  Start the day with a breakfast that is high in fiber, such as a cereal that contains at least 5 grams of fiber per serving.  Use beans in place of meat in soups, salads, or pasta.  Eat high-fiber snacks, such as berries, raw vegetables, nuts, or popcorn.   This information is not intended to replace advice given to you by your health care provider. Make sure you discuss  cereal that contains at least 5 grams of fiber per serving.   Use beans in place of meat in soups, salads, or pasta.   Eat high-fiber snacks, such as berries, raw vegetables, nuts, or popcorn.     This information is not intended to replace advice given to you by your health care provider. Make sure you discuss any questions you have with your health care provider.     Document Released: 06/20/2005 Document Revised: 07/11/2014 Document Reviewed: 12/03/2013  Elsevier Interactive Patient Education 2016 Elsevier  Inc.  Constipation, Adult  Constipation is when a person has fewer than three bowel movements a week, has difficulty having a bowel movement, or has stools that are dry, hard, or larger than normal. As people grow older, constipation is more common. A low-fiber diet, not taking in enough fluids, and taking certain medicines may make constipation worse.   CAUSES    Certain medicines, such as antidepressants, pain medicine, iron supplements, antacids, and water pills.    Certain diseases, such as diabetes, irritable bowel syndrome (IBS), thyroid disease, or depression.    Not drinking enough water.    Not eating enough fiber-rich foods.    Stress or travel.    Lack of physical activity or exercise.    Ignoring the urge to have a bowel movement.    Using laxatives too much.   SIGNS AND SYMPTOMS    Having fewer than three bowel movements a week.    Straining to have a bowel movement.    Having stools that are hard, dry, or larger than normal.    Feeling full or bloated.    Pain in the lower abdomen.    Not feeling relief after having a bowel movement.   DIAGNOSIS   Your health care provider will take a medical history and perform a physical exam. Further testing may be done for severe constipation. Some tests may include:   A barium enema X-ray to examine your rectum, colon, and, sometimes, your small intestine.    A sigmoidoscopy to examine your lower colon.    A colonoscopy to examine your entire colon.  TREATMENT   Treatment will depend on the severity of your constipation and what is causing it. Some dietary treatments include drinking more fluids and eating more fiber-rich foods. Lifestyle treatments may include regular exercise. If these diet and lifestyle recommendations do not help, your health care provider may recommend taking over-the-counter laxative medicines to help you have bowel movements. Prescription medicines may be prescribed if over-the-counter medicines do not work.    HOME CARE INSTRUCTIONS    Eat foods that have a lot of fiber, such as fruits, vegetables, whole grains, and beans.   Limit foods high in fat and processed sugars, such as french fries, hamburgers, cookies, candies, and soda.    A fiber supplement may be added to your diet if you cannot get enough fiber from foods.    Drink enough fluids to keep your urine clear or pale yellow.    Exercise regularly or as directed by your health care provider.    Go to the restroom when you have the urge to go. Do not hold it.    Only take over-the-counter or prescription medicines as directed by your health care provider. Do not take other medicines for constipation without talking to your health care provider first.   SEEK IMMEDIATE MEDICAL CARE IF:    You have bright red blood in your stool.    Your constipation lasts   04/01/2013 Elsevier Interactive Patient Education 2016 [place] for Irritable Bowel Syndrome When you have irritable bowel syndrome (IBS), the foods you eat and your eating habits are very important. IBS may cause various symptoms, such as abdominal pain, constipation, or diarrhea. Choosing the right foods can help ease discomfort caused by these symptoms. Work with your health care provider and dietitian to find the best eating plan to help control your symptoms. WHAT GENERAL GUIDELINES DO I NEED TO FOLLOW?  Keep a food  diary. This will help you identify foods that cause symptoms. Write down:  What you eat and when.  What symptoms you have.  When symptoms occur in relation to your meals.  Avoid foods that cause symptoms. Talk with your dietitian about other ways to get the same nutrients that are in these foods.  Eat more foods that contain fiber. Take a fiber supplement if directed by your dietitian.  Eat your meals slowly, in a relaxed setting.  Aim to eat 5-6 small meals per day. Do not skip meals.  Drink enough fluids to keep your urine clear or pale yellow.  Ask your health care provider if you should take an over-the-counter probiotic during flare-ups to help restore healthy gut bacteria.  If you have cramping or diarrhea, try making your meals low in fat and high in carbohydrates. Examples of carbohydrates are pasta, rice, whole grain breads and cereals, fruits, and vegetables.  If dairy products cause your symptoms to flare up, try eating less of them. You might be able to handle yogurt better than other dairy products because it contains bacteria that help with digestion. WHAT FOODS ARE NOT RECOMMENDED? The following are some foods and drinks that may worsen your symptoms:  Fatty foods, such as [place] fries.  Milk products, such as cheese or ice cream.  Chocolate.  Alcohol.  Products with caffeine, such as coffee.  Carbonated drinks, such as soda. The items listed above may not be a complete list of foods and beverages to avoid. Contact your dietitian for more information. WHAT FOODS ARE GOOD SOURCES OF FIBER? Your health care provider or dietitian may recommend that you eat more foods that contain fiber. Fiber can help reduce constipation and other IBS symptoms. Add foods with fiber to your diet a little at a time so that your body can get used to them. Too much fiber at once might cause gas and swelling of your abdomen. The following are some foods that are good sources of  fiber:  Apples.  Peaches.  Pears.  Berries.  Figs.  Broccoli (raw).  Cabbage.  Carrots.  Raw peas.  Kidney beans.  Lima beans.  Whole grain bread.  Whole grain cereal. FOR MORE INFORMATION  International Foundation for Functional Gastrointestinal Disorders: www.iffgd.[employer] of Diabetes and Digestive and Kidney Diseases: NetworkAffair.co.za.aspx   This information is not intended to replace advice given to you by your health care provider. Make sure you discuss any questions you have with your health care provider.   Document Released: 09/10/2003 Document Revised: 07/11/2014 Document Reviewed: 09/20/2013 Elsevier Interactive Patient Education [employer].

## 2015-05-25 NOTE — Telephone Encounter (Signed)
Patient called. He reports he was taking miralax BID but it wasn't working. I had intended for him to take a double dose BID and titrate up as needed. He was in the ER due to pain from constipation and eventually had a bowel movement with Amitiza but doesn't like the way it makes him feel. I reassured him the CT scan looks okay in regards to his colon. We are awaiting colonoscopy to be done in the next month or so. In the interim he can try increasing miralax further or try something else. He wishes to try Linzess. We will start at 177mcg daily and see how he does, he can titrate up to BID if needed. If he doesn't tolerate it or it doesn't work I asked him to call me back. He agreed.

## 2015-06-15 ENCOUNTER — Ambulatory Visit: Payer: Medicare Other | Admitting: Internal Medicine

## 2015-06-16 ENCOUNTER — Ambulatory Visit (INDEPENDENT_AMBULATORY_CARE_PROVIDER_SITE_OTHER): Payer: Medicare Other | Admitting: Podiatry

## 2015-06-16 ENCOUNTER — Encounter: Payer: Self-pay | Admitting: Podiatry

## 2015-06-16 VITALS — Ht 65.0 in | Wt 162.0 lb

## 2015-06-16 DIAGNOSIS — B351 Tinea unguium: Secondary | ICD-10-CM | POA: Diagnosis not present

## 2015-06-16 DIAGNOSIS — M79606 Pain in leg, unspecified: Secondary | ICD-10-CM | POA: Diagnosis not present

## 2015-06-16 NOTE — Patient Instructions (Signed)
Seen for hypertrophic nails. All nails debrided. Return in 3 months or as needed.  

## 2015-06-16 NOTE — Progress Notes (Signed)
SUBJECTIVE:  74 y.o. year old male presents stating that calluses and nails on both feet are painful. Patient requests for trimming.   OBJECTIVE:  DERMATOLOGIC EXAMINATION:  Thick hypertrophic nails x 10. Painful plantar Calluses ball of both feet.  VASCULAR EXAMINATION OF LOWER LIMBS:  Pedal pulses: All pedal pulses are palpable with normal pulsation.  NEUROLOGIC EXAMINATION OF THE LOWER LIMBS:  All epicritic and tactile sensations grossly intact bilateral.  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   ASSESSMENT:  Keratoderma plantar bilateral, painful.  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  Bilateral bunion with deformed metatarsal.  Pain in lower limb.   PLAN:  All nails and calluses debrided.  May return in 3 month or sooner if the callus pain becomes intolerable.

## 2015-06-25 ENCOUNTER — Ambulatory Visit: Payer: Medicare Other | Admitting: Podiatry

## 2015-07-13 ENCOUNTER — Encounter: Payer: Medicare Other | Admitting: Gastroenterology

## 2015-08-05 ENCOUNTER — Ambulatory Visit (INDEPENDENT_AMBULATORY_CARE_PROVIDER_SITE_OTHER): Payer: Medicare Other | Admitting: Internal Medicine

## 2015-08-05 ENCOUNTER — Encounter: Payer: Self-pay | Admitting: Internal Medicine

## 2015-08-05 VITALS — BP 142/70 | HR 76 | Temp 98.4°F | Resp 12 | Ht 65.0 in | Wt 163.8 lb

## 2015-08-05 DIAGNOSIS — G4700 Insomnia, unspecified: Secondary | ICD-10-CM

## 2015-08-05 DIAGNOSIS — F4310 Post-traumatic stress disorder, unspecified: Secondary | ICD-10-CM | POA: Diagnosis not present

## 2015-08-05 MED ORDER — TRAZODONE HCL 50 MG PO TABS: 50.0000 mg | ORAL_TABLET | Freq: Every evening | ORAL | Status: DC | PRN

## 2015-08-05 NOTE — Progress Notes (Signed)
Pre visit review using our clinic review tool, if applicable. No additional management support is needed unless otherwise documented below in the visit note. 

## 2015-08-05 NOTE — Patient Instructions (Signed)
We have sent in trazodone for the sleeping which may help a little with the depression.   Take 1 pill about 30 minutes before bedtime. It takes 3-4 days to get in your system so give it 5-6 days before we decide it will not work.   If this does not work it may be better to try more of a medicine for the mood and ptsd and see if that improves your sleeping.   Call us and let us know how it goes.

## 2015-08-07 DIAGNOSIS — G4700 Insomnia, unspecified: Secondary | ICD-10-CM | POA: Insufficient documentation

## 2015-08-07 DIAGNOSIS — F4310 Post-traumatic stress disorder, unspecified: Secondary | ICD-10-CM | POA: Insufficient documentation

## 2015-08-07 NOTE — Progress Notes (Signed)
   Subjective:    Patient ID: Jeffrey Grimes, male    DOB: 01-Jul-1941, 75 y.o.   MRN: [id number]  HPI The patient is a 75 YO man coming in for follow up on his new diagnosis of ptsd from the [place]. He saw a counselor who thought that his lack of sleep and bad sleep pattern was the cause of some of his symptoms. They have tried behavioral changes which did not help with his sleep. He has tried some medicine in the past which he thinks was called mirtazepine which was too strong and made him feel like a zombie although he did sleep. Mostly lack of focus during the day.   Review of Systems  Constitutional: Negative for fever, activity change, appetite change, fatigue and unexpected weight change.  HENT: Negative.   Respiratory: Negative for cough, chest tightness, shortness of breath and wheezing.   Cardiovascular: Negative for chest pain, palpitations and leg swelling.  Gastrointestinal: Negative for nausea, abdominal pain, diarrhea, constipation and abdominal distention.  Genitourinary: Negative.   Musculoskeletal: Negative for myalgias, back pain and arthralgias.  Skin: Negative.   Neurological: Negative.   Psychiatric/Behavioral: Positive for sleep disturbance, dysphoric mood and decreased concentration. Negative for suicidal ideas, behavioral problems, confusion and self-injury. The patient is not nervous/anxious and is not hyperactive.       Objective:   Physical Exam  Constitutional: He is oriented to person, place, and time. He appears well-developed and well-nourished.  HENT:  Head: Normocephalic and atraumatic.  Eyes: EOM are normal.  Neck: Normal range of motion.  Cardiovascular: Normal rate and regular rhythm.   Pulmonary/Chest: Effort normal and breath sounds normal. No respiratory distress. He has no wheezes. He has no rales.  Abdominal: Soft. Bowel sounds are normal. He exhibits no distension. There is no tenderness. There is no rebound.  Musculoskeletal: He exhibits no edema.   Neurological: He is alert and oriented to person, place, and time. Coordination normal.  Skin: Skin is warm and dry.  Psychiatric: He has a normal mood and affect. His behavior is normal.   Filed Vitals:   08/05/15 1002  BP: 142/70  Pulse: 76  Temp: 98.4 F (36.9 C)  TempSrc: Oral  Resp: 12  Height: 5\' 5"  (1.651 m)  Weight: 163 lb 12.8 oz (74.299 kg)  SpO2: 98%      Assessment & Plan:

## 2015-08-07 NOTE — Assessment & Plan Note (Signed)
Rx for trazodone as previous agent was too strong. He will try and increase dosing as needed. Starting with 1/2 pill with bedtime.

## 2015-08-07 NOTE — Assessment & Plan Note (Signed)
Prior treatment too strong per his words and unclear what dosing they started with. Will try trazodone for sleep. 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

## 2015-08-12 ENCOUNTER — Encounter: Payer: Self-pay | Admitting: Gastroenterology

## 2015-08-12 ENCOUNTER — Ambulatory Visit (AMBULATORY_SURGERY_CENTER): Payer: Medicare Other | Admitting: Gastroenterology

## 2015-08-12 VITALS — BP 131/78 | HR 61 | Temp 97.2°F | Resp 14 | Ht 64.0 in | Wt 162.0 lb

## 2015-08-12 DIAGNOSIS — D127 Benign neoplasm of rectosigmoid junction: Secondary | ICD-10-CM

## 2015-08-12 DIAGNOSIS — K635 Polyp of colon: Secondary | ICD-10-CM | POA: Diagnosis not present

## 2015-08-12 DIAGNOSIS — K5900 Constipation, unspecified: Secondary | ICD-10-CM

## 2015-08-12 MED ORDER — SODIUM CHLORIDE 0.9 % IV SOLN: 500.0000 mL | INTRAVENOUS | Status: DC

## 2015-08-12 NOTE — Progress Notes (Signed)
Report to PACU, RN, vss, BBS= Clear.  

## 2015-08-12 NOTE — Progress Notes (Signed)
Called to room to assist during endoscopic procedure.  Patient ID and intended procedure confirmed with present staff. Received instructions for my participation in the procedure from the performing physician.  

## 2015-08-12 NOTE — Patient Instructions (Signed)
YOU HAD AN ENDOSCOPIC PROCEDURE TODAY AT [facility]:   Refer to the procedure report that was given to you for any specific questions about what was found during the examination.  If the procedure report does not answer your questions, please call your gastroenterologist to clarify.  If you requested that your care partner not be given the details of your procedure findings, then the procedure report has been included in a sealed envelope for you to review at your convenience later.  YOU SHOULD EXPECT: Some feelings of bloating in the abdomen. Passage of more gas than usual.  Walking can help get rid of the air that was put into your GI tract during the procedure and reduce the bloating. If you had a lower endoscopy (such as a colonoscopy or flexible sigmoidoscopy) you may notice spotting of blood in your stool or on the toilet paper. If you underwent a bowel prep for your procedure, you may not have a normal bowel movement for a few days.  Please Note:  You might notice some irritation and congestion in your nose or some drainage.  This is from the oxygen used during your procedure.  There is no need for concern and it should clear up in a day or so.  SYMPTOMS TO REPORT IMMEDIATELY:   Following lower endoscopy (colonoscopy or flexible sigmoidoscopy):  Excessive amounts of blood in the stool  Significant tenderness or worsening of abdominal pains  Swelling of the abdomen that is new, acute  Fever of 100F or higher   For urgent or emergent issues, a gastroenterologist can be reached at any hour by calling [phone].   DIET: Your first meal following the procedure should be a small meal and then it is ok to progress to your normal diet. Heavy or fried foods are harder to digest and may make you feel nauseous or bloated.  Likewise, meals heavy in dairy and vegetables can increase bloating.  Drink plenty of fluids but you should avoid alcoholic beverages for 24  hours.  ACTIVITY:  You should plan to take it easy for the rest of today and you should NOT DRIVE or use heavy machinery until tomorrow (because of the sedation medicines used during the test).    FOLLOW UP: Our staff will call the number listed on your records the next business day following your procedure to check on you and address any questions or concerns that you may have regarding the information given to you following your procedure. If we do not reach you, we will leave a message.  However, if you are feeling well and you are not experiencing any problems, there is no need to return our call.  We will assume that you have returned to your regular daily activities without incident.  If any biopsies were taken you will be contacted by phone or by letter within the next 1-3 weeks.  Please call us at [phone] if you have not heard about the biopsies in 3 weeks.    SIGNATURES/CONFIDENTIALITY: You and/or your care partner have signed paperwork which will be entered into your electronic medical record.  These signatures attest to the fact that that the information above on your After Visit Summary has been reviewed and is understood.  Full responsibility of the confidentiality of this discharge information lies with you and/or your care-partner.  Polyp/ Diverticulosis/Hemorrhoids handout given Resume diet and medication

## 2015-08-12 NOTE — Op Note (Signed)
[place]  [address] [place], [id number]   COLONOSCOPY PROCEDURE REPORT  PATIENT: Jeffrey Grimes, Jeffrey Grimes  MR#: [id number] BIRTHDATE: 1940-12-24 , 74  yrs. old GENDER: male ENDOSCOPIST: Yetta Flock, MD REFERRED BY: Pricilla Holm MD PROCEDURE DATE:  08/12/2015 PROCEDURE:   Colonoscopy, diagnostic and Colonoscopy with biopsy First Screening Colonoscopy - Avg.  risk and is 50 yrs.  old or older - No.  Prior Negative Screening - Now for repeat screening. N/A  History of Adenoma - Now for follow-up colonoscopy & has been > or = to 3 yrs.  Yes hx of adenoma.  Has been 3 or more years since last colonoscopy.  Polyps removed today? Yes ASA CLASS:   Class II INDICATIONS:change in bowel habits, history of colon polyps, average risk. MEDICATIONS: Propofol 200 mg IV  DESCRIPTION OF PROCEDURE:   After the risks benefits and alternatives of the procedure were thoroughly explained, informed consent was obtained.  The digital rectal exam revealed no abnormalities of the rectum.   The LB [id number]  endoscope was introduced through the anus and advanced to the cecum, which was identified by both the appendix and ileocecal valve. No adverse events experienced.   The quality of the prep was adequate  The instrument was then slowly withdrawn as the colon was fully examined. Estimated blood loss is zero unless otherwise noted in this procedure report.   COLON FINDINGS: A 47mm flat polyp was noted in the rectosigmoid colon and removed with cold forceps.  There was moderate diverticulosis of the left colon.  The remainder of the examined colon appeared normal without mass lesions or inflammatory changes.  Retroflexed views revealed internal hemorrhoids. The time to cecum = 2.4 Withdrawal time = 13.7   The scope was withdrawn and the procedure completed. COMPLICATIONS: There were no immediate complications.  ENDOSCOPIC IMPRESSION: Small rectosigmoid colon polyp,  removed Diverticulosis Internal hemorrhoids  RECOMMENDATIONS: Await pathology results Resume diet Resume medications  eSigned:  Yetta Flock, MD 08/12/2015 4:18 PM   cc: Pricilla Holm MD, the patient

## 2015-08-13 ENCOUNTER — Telehealth: Payer: Self-pay | Admitting: Emergency Medicine

## 2015-08-13 NOTE — Telephone Encounter (Signed)
  Follow up Call-  Call back number 08/12/2015  Post procedure Call Back phone  # [phone]  Permission to leave phone message Yes     Patient questions:  Do you have a fever, pain , or abdominal swelling? No. Pain Score  0 *  Have you tolerated food without any problems? Yes.    Have you been able to return to your normal activities? Yes.    Do you have any questions about your discharge instructions: Diet   No. Medications  No. Follow up visit  No.  Do you have questions or concerns about your Care? No.  Actions: * If pain score is 4 or above: No action needed, pain <4.

## 2015-09-14 ENCOUNTER — Encounter: Payer: Self-pay | Admitting: Podiatry

## 2015-09-14 ENCOUNTER — Ambulatory Visit (INDEPENDENT_AMBULATORY_CARE_PROVIDER_SITE_OTHER): Payer: Medicare Other | Admitting: Podiatry

## 2015-09-14 VITALS — BP 155/78 | HR 64

## 2015-09-14 DIAGNOSIS — Q828 Other specified congenital malformations of skin: Secondary | ICD-10-CM

## 2015-09-14 DIAGNOSIS — B351 Tinea unguium: Secondary | ICD-10-CM | POA: Diagnosis not present

## 2015-09-14 DIAGNOSIS — M79606 Pain in leg, unspecified: Secondary | ICD-10-CM

## 2015-09-14 NOTE — Patient Instructions (Signed)
Seen for hypertrophic nails. All nails debrided. Return in 3 months or as needed.  

## 2015-09-14 NOTE — Progress Notes (Signed)
SUBJECTIVE:  75 y.o. year old male presents stating that calluses and nails on both feet are painful. Patient requests for trimming.  Just had his wisdom tooth pulled and in pain.   OBJECTIVE:  DERMATOLOGIC EXAMINATION:  Thick hypertrophic nails x 10. Painful plantar Calluses ball of both feet.  VASCULAR EXAMINATION OF LOWER LIMBS:  Pedal pulses: All pedal pulses are palpable with normal pulsation.  NEUROLOGIC EXAMINATION OF THE LOWER LIMBS:  All epicritic and tactile sensations grossly intact bilateral.  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   ASSESSMENT:  Keratoderma plantar bilateral, painful.  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  Bilateral bunion with deformed metatarsal.  Pain in lower limb.   PLAN:  All nails and calluses debrided.  May return in 3 month or sooner if the callus pain becomes intolerable.

## 2015-12-15 ENCOUNTER — Encounter: Payer: Self-pay | Admitting: Podiatry

## 2015-12-15 ENCOUNTER — Ambulatory Visit (INDEPENDENT_AMBULATORY_CARE_PROVIDER_SITE_OTHER): Payer: Medicare Other | Admitting: Podiatry

## 2015-12-15 VITALS — BP 145/72 | HR 63

## 2015-12-15 DIAGNOSIS — Q828 Other specified congenital malformations of skin: Secondary | ICD-10-CM

## 2015-12-15 DIAGNOSIS — M79606 Pain in leg, unspecified: Secondary | ICD-10-CM | POA: Diagnosis not present

## 2015-12-15 DIAGNOSIS — B351 Tinea unguium: Secondary | ICD-10-CM

## 2015-12-15 NOTE — Patient Instructions (Signed)
Seen for hypertrophic nails and calluses. All nails and calluses debrided. Return in 3 months or as needed.  

## 2015-12-15 NOTE — Progress Notes (Signed)
SUBJECTIVE:  75 y.o. year old male presents stating that calluses and nails on both feet are painful. Patient requests for trimming.  Just had his wisdom tooth pulled and in pain.   OBJECTIVE:  DERMATOLOGIC EXAMINATION:  Thick hypertrophic nails x 10. Painful plantar Calluses ball of both feet.  VASCULAR EXAMINATION OF LOWER LIMBS:  Pedal pulses: All pedal pulses are palpable with normal pulsation.  NEUROLOGIC EXAMINATION OF THE LOWER LIMBS:  All epicritic and tactile sensations grossly intact bilateral.  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   ASSESSMENT:  Keratoderma plantar bilateral, painful.  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  Bilateral bunion with deformed metatarsal.  Pain in lower limb.   PLAN:  All nails and calluses debrided.  May return in 3 month or sooner if the callus pain becomes intolerable.

## 2016-02-03 ENCOUNTER — Encounter: Payer: Self-pay | Admitting: Internal Medicine

## 2016-02-03 ENCOUNTER — Other Ambulatory Visit (INDEPENDENT_AMBULATORY_CARE_PROVIDER_SITE_OTHER): Payer: Medicare Other

## 2016-02-03 ENCOUNTER — Ambulatory Visit (INDEPENDENT_AMBULATORY_CARE_PROVIDER_SITE_OTHER): Payer: Medicare Other | Admitting: Internal Medicine

## 2016-02-03 VITALS — BP 160/78 | HR 64 | Temp 98.5°F | Resp 12 | Ht 65.0 in | Wt 162.0 lb

## 2016-02-03 DIAGNOSIS — R5383 Other fatigue: Secondary | ICD-10-CM

## 2016-02-03 DIAGNOSIS — E039 Hypothyroidism, unspecified: Secondary | ICD-10-CM | POA: Diagnosis not present

## 2016-02-03 DIAGNOSIS — E538 Deficiency of other specified B group vitamins: Secondary | ICD-10-CM

## 2016-02-03 LAB — COMPREHENSIVE METABOLIC PANEL
ALBUMIN: 4 g/dL (ref 3.5–5.2)
ALK PHOS: 48 U/L (ref 39–117)
ALT: 16 U/L (ref 0–53)
AST: 23 U/L (ref 0–37)
BILIRUBIN TOTAL: 0.5 mg/dL (ref 0.2–1.2)
BUN: 11 mg/dL (ref 6–23)
CO2: 24 mEq/L (ref 19–32)
CREATININE: 0.99 mg/dL (ref 0.40–1.50)
Calcium: 9.6 mg/dL (ref 8.4–10.5)
Chloride: 105 mEq/L (ref 96–112)
GFR: 95.13 mL/min (ref >60.00)
Glucose, Bld: 113 mg/dL — ABNORMAL HIGH (ref 70–99)
POTASSIUM: 3.8 meq/L (ref 3.5–5.1)
Sodium: 138 mEq/L (ref 135–145)
Total Protein: 7.8 g/dL (ref 6.0–8.3)

## 2016-02-03 LAB — CBC
HEMATOCRIT: 40.6 % (ref 39.0–52.0)
HEMOGLOBIN: 13.8 g/dL (ref 13.0–17.0)
MCHC: 33.9 g/dL (ref 30.0–36.0)
MCV: 92.8 fl (ref 78.0–100.0)
Platelets: 163 10*3/uL (ref 150.0–400.0)
RBC: 4.37 Mil/uL (ref 4.22–5.81)
RDW: 15.3 % (ref 11.5–15.5)
WBC: 3.9 10*3/uL — AB (ref 4.0–10.5)

## 2016-02-03 LAB — T4, FREE: Free T4: 0.61 ng/dL (ref 0.60–1.60)

## 2016-02-03 LAB — TSH: TSH: 2.9 u[IU]/mL (ref 0.35–4.50)

## 2016-02-03 LAB — VITAMIN B12: Vitamin B-12: 238 pg/mL (ref 211–911)

## 2016-02-03 NOTE — Patient Instructions (Signed)
We will check the labs today and call you back with the results.   If needed we will send in a different dose of the thyroid medicine or another treatment if needed.

## 2016-02-03 NOTE — Progress Notes (Signed)
   Subjective:    Patient ID: Jeffrey Grimes, male    DOB: 03/05/1941, 75 y.o.   MRN: [id number]  HPI The patient is a 75 YO man coming in for thyroid problems and fatigue. He has stopped taking his thyroid medicine a long time ago (he is not sure how long but months at least). He is feeling tired during the day but he is still able to do what he needs to do. Struggling with some PTSD with night terrors but denies depression symptoms. Denies change to diet or exercise.   Review of Systems  Constitutional: Positive for fatigue. Negative for activity change, appetite change, fever and unexpected weight change.  Respiratory: Negative for cough, chest tightness, shortness of breath and wheezing.   Cardiovascular: Negative for chest pain, palpitations and leg swelling.  Gastrointestinal: Negative for abdominal distention, abdominal pain, constipation, diarrhea and nausea.  Musculoskeletal: Negative for arthralgias, back pain and myalgias.  Skin: Negative.   Neurological: Negative.   Psychiatric/Behavioral: Positive for sleep disturbance. Negative for behavioral problems, confusion, self-injury and suicidal ideas. The patient is not nervous/anxious and is not hyperactive.       Objective:   Physical Exam  Constitutional: He is oriented to person, place, and time. He appears well-developed and well-nourished.  HENT:  Head: Normocephalic and atraumatic.  Eyes: EOM are normal.  Neck: Normal range of motion.  Cardiovascular: Normal rate and regular rhythm.   Pulmonary/Chest: Effort normal and breath sounds normal. No respiratory distress. He has no wheezes. He has no rales.  Abdominal: Soft. He exhibits no distension. There is no tenderness. There is no rebound.  Neurological: He is alert and oriented to person, place, and time. Coordination normal.  Skin: Skin is warm and dry.   Vitals:   02/03/16 1004  BP: (!) 160/94  Pulse: 64  Resp: 12  Temp: 98.5 F (36.9 C)  TempSrc: Oral  SpO2:  98%  Weight: 162 lb (73.5 kg)  Height: 5\' 5"  (1.651 m)      Assessment & Plan:

## 2016-02-03 NOTE — Progress Notes (Signed)
Pre visit review using our clinic review tool, if applicable. No additional management support is needed unless otherwise documented below in the visit note. 

## 2016-02-03 NOTE — Assessment & Plan Note (Signed)
Stopped taking his medicine and now with signs of low thyroid. Checking TSH and free T4 and adjust as needed. Previously was taking 100 mcg synthroid daily.

## 2016-02-03 NOTE — Assessment & Plan Note (Signed)
Checking CMP, CBC, B12, thyroid. Likely thyroid since he is off his medicine.

## 2016-02-16 ENCOUNTER — Encounter: Payer: Self-pay | Admitting: Podiatry

## 2016-02-16 ENCOUNTER — Ambulatory Visit (INDEPENDENT_AMBULATORY_CARE_PROVIDER_SITE_OTHER): Payer: Medicare Other | Admitting: Podiatry

## 2016-02-16 VITALS — BP 130/72 | HR 62

## 2016-02-16 DIAGNOSIS — Q828 Other specified congenital malformations of skin: Secondary | ICD-10-CM | POA: Diagnosis not present

## 2016-02-16 DIAGNOSIS — M21969 Unspecified acquired deformity of unspecified lower leg: Secondary | ICD-10-CM | POA: Diagnosis not present

## 2016-02-16 DIAGNOSIS — B351 Tinea unguium: Secondary | ICD-10-CM | POA: Diagnosis not present

## 2016-02-16 DIAGNOSIS — M79606 Pain in leg, unspecified: Secondary | ICD-10-CM

## 2016-02-16 NOTE — Patient Instructions (Signed)
Seen for hypertrophic nails, corns, and calluses. All nails, corns, and calluses debrided. Return in 2 months or as needed.

## 2016-02-16 NOTE — Progress Notes (Signed)
SUBJECTIVE:  75 y.o. year old male presents stating that calluses and nails on both feet are painful.  Patient requests for trimming.   OBJECTIVE:  DERMATOLOGIC EXAMINATION:  Thick hypertrophic nails x 10. Painful plantar Calluses ball of both feet.  VASCULAR EXAMINATION OF LOWER LIMBS:  Pedal pulses: All pedal pulses are palpable with normal pulsation.  NEUROLOGIC EXAMINATION OF THE LOWER LIMBS:  All epicritic and tactile sensations grossly intact bilateral.  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   ASSESSMENT:  Keratoderma plantar bilateral, painful.  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  Bilateral bunion with deformed metatarsal.  Pain in lower limb.   PLAN:  All nails and calluses debrided.  May return in 3 month or sooner if the callus pain becomes intolerable.

## 2016-04-19 ENCOUNTER — Encounter: Payer: Self-pay | Admitting: Podiatry

## 2016-04-19 ENCOUNTER — Ambulatory Visit (INDEPENDENT_AMBULATORY_CARE_PROVIDER_SITE_OTHER): Payer: Medicare Other | Admitting: Podiatry

## 2016-04-19 VITALS — BP 154/85 | HR 65

## 2016-04-19 DIAGNOSIS — B351 Tinea unguium: Secondary | ICD-10-CM

## 2016-04-19 DIAGNOSIS — M79672 Pain in left foot: Secondary | ICD-10-CM | POA: Diagnosis not present

## 2016-04-19 DIAGNOSIS — M79671 Pain in right foot: Secondary | ICD-10-CM

## 2016-04-19 DIAGNOSIS — M21969 Unspecified acquired deformity of unspecified lower leg: Secondary | ICD-10-CM

## 2016-04-19 DIAGNOSIS — Q828 Other specified congenital malformations of skin: Secondary | ICD-10-CM

## 2016-04-19 NOTE — Patient Instructions (Signed)
Seen for painful corns, calluses, and hypertrophic nails. All nails and calluses debrided. Return in 2 months or as needed.

## 2016-04-19 NOTE — Progress Notes (Signed)
SUBJECTIVE: 75 y.o. year old male presents complaining of painful calluses and nails on both feet. Patient requests trimming.   OBJECTIVE: DERMATOLOGIC EXAMINATION: Thick hypertrophic nails x 10. Multiple plantar Calluses ball of both feet, symptomatic. VASCULAR EXAMINATION OF LOWER LIMBS: Pedal pulses: All pedal pulses are palpable with normal pulsation.  NEUROLOGIC EXAMINATION OF THE LOWER LIMBS: All epicritic and tactile sensations grossly intact bilateral.  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   ASSESSMENT: Keratoderma plantar bilateral, painful.  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  Bilateral bunion with deformed metatarsal.  Pain in lower limb.   PLAN: All nails and calluses debrided.  May return in 2 month or sooner if need.

## 2016-06-21 ENCOUNTER — Encounter: Payer: Self-pay | Admitting: Podiatry

## 2016-06-21 ENCOUNTER — Ambulatory Visit (INDEPENDENT_AMBULATORY_CARE_PROVIDER_SITE_OTHER): Payer: Medicare Other | Admitting: Podiatry

## 2016-06-21 VITALS — BP 152/84 | HR 65

## 2016-06-21 DIAGNOSIS — M21969 Unspecified acquired deformity of unspecified lower leg: Secondary | ICD-10-CM

## 2016-06-21 DIAGNOSIS — M79672 Pain in left foot: Secondary | ICD-10-CM | POA: Diagnosis not present

## 2016-06-21 DIAGNOSIS — Q828 Other specified congenital malformations of skin: Secondary | ICD-10-CM | POA: Diagnosis not present

## 2016-06-21 DIAGNOSIS — B351 Tinea unguium: Secondary | ICD-10-CM

## 2016-06-21 DIAGNOSIS — M79671 Pain in right foot: Secondary | ICD-10-CM | POA: Diagnosis not present

## 2016-06-21 NOTE — Patient Instructions (Signed)
Seen for painful calluses and hypertrophic nails. All nails and calluses debrided. Return in 2 months or as needed.  

## 2016-06-21 NOTE — Progress Notes (Signed)
SUBJECTIVE: 75 y.o. year old male presents complaining of painful calluses and nails on both feet. Patient requests trimming. Also having problem with burning and shooting pain in both feet at times.  OBJECTIVE: DERMATOLOGIC EXAMINATION: Thick hypertrophic nails x 10. Multiple plantar Calluses ball of both feet, symptomatic. VASCULAR EXAMINATION OF LOWER LIMBS: Pedal pulses: All pedal pulses are palpable with normal pulsation.  NEUROLOGIC EXAMINATION OF THE LOWER LIMBS: All epicritic and tactile sensations grossly intact bilateral.  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   ASSESSMENT: Keratoderma plantar bilateral, painful.  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  Bilateral bunion with deformed metatarsal.  Pain in lower limb.   PLAN: All nails and calluses debrided.  May return in 2 month or sooner if need

## 2016-07-29 ENCOUNTER — Ambulatory Visit (INDEPENDENT_AMBULATORY_CARE_PROVIDER_SITE_OTHER): Payer: Medicare Other | Admitting: *Deleted

## 2016-07-29 VITALS — BP 163/92 | HR 69 | Resp 16 | Ht 65.0 in | Wt 167.2 lb

## 2016-07-29 DIAGNOSIS — Z23 Encounter for immunization: Secondary | ICD-10-CM

## 2016-07-29 DIAGNOSIS — I10 Essential (primary) hypertension: Secondary | ICD-10-CM

## 2016-07-29 DIAGNOSIS — F4310 Post-traumatic stress disorder, unspecified: Secondary | ICD-10-CM

## 2016-07-29 DIAGNOSIS — E039 Hypothyroidism, unspecified: Secondary | ICD-10-CM

## 2016-07-29 DIAGNOSIS — Z0000 Encounter for general adult medical examination without abnormal findings: Secondary | ICD-10-CM | POA: Diagnosis not present

## 2016-07-29 DIAGNOSIS — F329 Major depressive disorder, single episode, unspecified: Secondary | ICD-10-CM

## 2016-07-29 DIAGNOSIS — F32A Depression, unspecified: Secondary | ICD-10-CM

## 2016-07-29 NOTE — Assessment & Plan Note (Signed)
PHQ-9=12. Pt reports at his baseline. He is safe in current environment, no suicide plan. Psych/counseling resource list provided. Ongoing management per PCP.

## 2016-07-29 NOTE — Assessment & Plan Note (Signed)
BP elevated today. Pt reports compliance w/ medication and has already taken meds today. Pt will keep home BP log and bring to next appointment.   BP Readings from Last 3 Encounters:  07/29/16 (!) 163/92  06/21/16 (!) 152/84  04/19/16 (!) 154/85

## 2016-07-29 NOTE — Patient Instructions (Addendum)
Jeffrey Grimes , Thank you for taking time to come for your Medicare Wellness Visit. I appreciate your ongoing commitment to your health goals. Please review the following plan we discussed and let me know if I can assist you in the future.   These are the goals we discussed: Goals    . Improve quality of sleep       This is a list of the screening recommended for you and due dates:  Health Maintenance  Topic Date Due  . Flu Shot  02/02/2016  . Shingles Vaccine  02/02/2017*  . Pneumonia vaccines (1 of 2 - PCV13) 02/02/2017*  . Tetanus Vaccine  08/04/2024  . Colon Cancer Screening  08/11/2025  *Topic was postponed. The date shown is not the original due date.   Influenza (Flu) Vaccine (Inactivated or Recombinant): What You Need to Know 1. Why get vaccinated? Influenza ("flu") is a contagious disease that spreads around the [place] every year, usually between October and May. Flu is caused by influenza viruses, and is spread mainly by coughing, sneezing, and close contact. Anyone can get flu. Flu strikes suddenly and can last several days. Symptoms vary by age, but can include:  fever/chills  sore throat  muscle aches  fatigue  cough  headache  runny or stuffy nose Flu can also lead to pneumonia and blood infections, and cause diarrhea and seizures in children. If you have a medical condition, such as heart or lung disease, flu can make it worse. Flu is more dangerous for some people. Infants and young children, people 80 years of age and older, pregnant women, and people with certain health conditions or a weakened immune system are at greatest risk. Each year thousands of people in the [place] die from flu, and many more are hospitalized. Flu vaccine can:  keep you from getting flu,  make flu less severe if you do get it, and  keep you from spreading flu to your family and other people. 2. Inactivated and recombinant flu vaccines A dose of flu vaccine is  recommended every flu season. Children 6 months through 22 years of age may need two doses during the same flu season. Everyone else needs only one dose each flu season. Some inactivated flu vaccines contain a very small amount of a mercury-based preservative called thimerosal. Studies have not shown thimerosal in vaccines to be harmful, but flu vaccines that do not contain thimerosal are available. There is no live flu virus in flu shots. They cannot cause the flu. There are many flu viruses, and they are always changing. Each year a new flu vaccine is made to protect against three or four viruses that are likely to cause disease in the upcoming flu season. But even when the vaccine doesn't exactly match these viruses, it may still provide some protection. Flu vaccine cannot prevent:  flu that is caused by a virus not covered by the vaccine, or  illnesses that look like flu but are not. It takes about 2 weeks for protection to develop after vaccination, and protection lasts through the flu season. 3. Some people should not get this vaccine Tell the person who is giving you the vaccine:  If you have any severe, life-threatening allergies. If you ever had a life-threatening allergic reaction after a dose of flu vaccine, or have a severe allergy to any part of this vaccine, you may be advised not to get vaccinated. Most, but not all, types of flu vaccine contain a small amount  of egg protein.  If you ever had Guillain-Barr Syndrome (also called GBS). Some people with a history of GBS should not get this vaccine. This should be discussed with your doctor.  If you are not feeling well. It is usually okay to get flu vaccine when you have a mild illness, but you might be asked to come back when you feel better. 4. Risks of a vaccine reaction With any medicine, including vaccines, there is a chance of reactions. These are usually mild and go away on their own, but serious reactions are also  possible. Most people who get a flu shot do not have any problems with it. Minor problems following a flu shot include:  soreness, redness, or swelling where the shot was given  hoarseness  sore, red or itchy eyes  cough  fever  aches  headache  itching  fatigue If these problems occur, they usually begin soon after the shot and last 1 or 2 days. More serious problems following a flu shot can include the following:  There may be a small increased risk of Guillain-Barre Syndrome (GBS) after inactivated flu vaccine. This risk has been estimated at 1 or 2 additional cases per million people vaccinated. This is much lower than the risk of severe complications from flu, which can be prevented by flu vaccine.  Young children who get the flu shot along with pneumococcal vaccine (PCV13) and/or DTaP vaccine at the same time might be slightly more likely to have a seizure caused by fever. Ask your doctor for more information. Tell your doctor if a child who is getting flu vaccine has ever had a seizure. Problems that could happen after any injected vaccine:  People sometimes faint after a medical procedure, including vaccination. Sitting or lying down for about 15 minutes can help prevent fainting, and injuries caused by a fall. Tell your doctor if you feel dizzy, or have vision changes or ringing in the ears.  Some people get severe pain in the shoulder and have difficulty moving the arm where a shot was given. This happens very rarely.  Any medication can cause a severe allergic reaction. Such reactions from a vaccine are very rare, estimated at about 1 in a million doses, and would happen within a few minutes to a few hours after the vaccination. As with any medicine, there is a very remote chance of a vaccine causing a serious injury or death. The safety of vaccines is always being monitored. For more information, visit: http://www.aguilar.org/ 5. What if there is a serious  reaction? What should I look for? Look for anything that concerns you, such as signs of a severe allergic reaction, very high fever, or unusual behavior. Signs of a severe allergic reaction can include hives, swelling of the face and throat, difficulty breathing, a fast heartbeat, dizziness, and weakness. These would start a few minutes to a few hours after the vaccination. What should I do?  If you think it is a severe allergic reaction or other emergency that can't wait, call 9-1-1 and get the person to the nearest hospital. Otherwise, call your doctor.  Reactions should be reported to the Vaccine Adverse Event Reporting System (VAERS). Your doctor should file this report, or you can do it yourself through the VAERS web site at www.vaers.SamedayNews.es, or by calling (740)543-8316.  VAERS does not give medical advice. 6. The National Vaccine Injury Compensation Program The National Vaccine Injury Compensation Program (VICP) is a federal program that was created to compensate people  who may have been injured by certain vaccines. Persons who believe they may have been injured by a vaccine can learn about the program and about filing a claim by calling 662-847-9367 or visiting the [employer] website at GoldCloset.com.ee. There is a time limit to file a claim for compensation. 7. How can I learn more?  Ask your healthcare provider. He or she can give you the vaccine package insert or suggest other sources of information.  Call your local or state health department.  Contact the Centers for Disease Control and Prevention (CDC):  Call 386 343 3753 (1-800-CDC-INFO) or  Visit CDC's website at https://gibson.com/ Vaccine Information Statement, Inactivated Influenza Vaccine (02/07/2014) This information is not intended to replace advice given to you by your health care provider. Make sure you discuss any questions you have with your health care provider. Document Released: 04/14/2006 Document  Revised: 03/10/2016 Document Reviewed: 03/10/2016 Elsevier Interactive Patient Education  2017 [employer].

## 2016-07-29 NOTE — Progress Notes (Signed)
Pre visit review using our clinic review tool, if applicable. No additional management support is needed unless otherwise documented below in the visit note. 

## 2016-07-29 NOTE — Progress Notes (Signed)
Subjective:   Jeffrey Grimes is a 76 y.o. male who presents for an Initial Medicare Annual Wellness Visit.  Patient reports left sided intermittent chest soreness. Occurring 1-2 days per week x 1 month. No identifiable triggers. Denies nausea, numbness of extremities, diaphoresis, sob. Explained he takes an aspirin which relieves the symptom.   Patient would like to discuss trial of cialis with PCP.   Patient is concern about "STM" loss and difficulty concentrating. Stating he has difficulty navigating directions when is leaving from somewhere new.  Review of Systems  No ROS.  Medicare Wellness Visit.  Cardiac Risk Factors include: male gender;advanced age (>36men, >56 women);hypertension  Sleep patterns: has difficulty falling asleep, feels rested on waking, gets up 2-3 times nightly to void and sleeps 4-5 hours nightly.   Home Safety/Smoke Alarms:  Lives alone, feels safe at home, smoke alarms in  Place. Living environment; residence and Firearm Safety: no firearms. Seat Belt Safety/Bike Helmet: Wears seat belt.   Counseling:   Eye Exam- Has glaucoma, follows with eye doctor twice yearly. Dental- partial on the bottom, goes to the dentist as needed.   Male:   CC[facility]-  Last 08/12/15 with Dr. Crete Cellar. 1 polyp removed, diverticulosis, internal hemorrhoids. Follow-up per Dr. Enis Gash.    [facility]- [facility]    Objective:    Today's Vitals   07/29/16 1327  BP: (!) 163/92  Pulse: 69  Resp: 16  SpO2: 99%  Weight: 167 lb 4 oz (75.9 kg)  Height: 5\' 5"  (1.651 m)   Body mass index is 27.83 kg/m.  Current Medications (verified) Outpatient Encounter Prescriptions as of 07/29/2016  Medication Sig  . bimatoprost (LUMIGAN) 0.01 % SOLN Place 1 drop into both eyes at bedtime.   . Cholecalciferol (VITAMIN D PO) Take 1 capsule by mouth daily.  . COMBIGAN 0.2-0.5 % ophthalmic solution Place 1 drop into both eyes daily.   . Cyanocobalamin (VITAMIN B 12 PO) Take 1,000  mcg by mouth daily.  . Melatonin-Pyridoxine (MELATIN PO) Take 1 tablet by mouth at bedtime as needed.  Marland Kitchen levothyroxine (SYNTHROID, LEVOTHROID) 100 MCG tablet Take 1 tablet (100 mcg total) by mouth daily. (Patient not taking: Reported on 07/29/2016)  . Linaclotide (LINZESS) 145 MCG CAPS capsule Take 1 capsule (145 mcg total) by mouth daily. (Patient not taking: Reported on 07/29/2016)  . [DISCONTINUED] amoxicillin (AMOXIL) 500 MG capsule    No facility-administered encounter medications on file as of 07/29/2016.     Allergies (verified) Patient has no known allergies.   History: Past Medical History:  Diagnosis Date  . Anxiety   . Colon polyps   . Constipation   . Depression   . Glaucoma   . Heart murmur   . Hypertension   . Thyroid disease    Past Surgical History:  Procedure Laterality Date  . COLONOSCOPY     done in [place] 2010   Family History  Problem Relation Age of Onset  . Diabetes Mother   . Hypertension Mother   . Heart disease Mother   . Hypertension Father   . Heart disease Father   . Thyroid disease Sister   . Hypertension Brother   . Diabetes Brother   . Prostate cancer Brother   . Heart disease Brother    Social History   Occupational History  . Retired     Social History Main Topics  . Smoking status: Former Research scientist (life sciences)  . Smokeless tobacco: Never Used  . Alcohol use 0.0  oz/week     Comment: socially   . Drug use: No  . Sexual activity: No   Tobacco Counseling Counseling given: Not Answered   Activities of Daily Living In your present state of health, do you have any difficulty performing the following activities: 07/29/2016  Hearing? N  Vision? N  Difficulty concentrating or making decisions? N  Walking or climbing stairs? N  Dressing or bathing? N  Doing errands, shopping? N  Preparing Food and eating ? N  Using the Toilet? N  In the past six months, have you accidently leaked urine? N  Do you have problems with loss of bowel control? N    Managing your Medications? N  Managing your Finances? N  Housekeeping or managing your Housekeeping? N  Some recent data might be hidden    Immunizations and Health Maintenance Immunization History  Administered Date(s) Administered  . Influenza,inj,Quad PF,36+ Mos 04/13/2015, 07/29/2016  . Tdap 08/04/2014   There are no preventive care reminders to display for this patient.  Patient Care Team: Hoyt Koch, MD as PCP - General (Internal Medicine)  Indicate any recent Medical Services you may have received from other than [facility] providers in the past year (date may be approximate).    Assessment:   This is a routine wellness examination for Jeffrey Grimes. Physical assessment deferred to PCP.   Hearing/Vision screen Hearing Screening Comments: Able to hear conversational tones w/o difficulty. Passed whisper test, history of cerumen impaction  Vision Screening Comments: [place], has eye exam x2 yearly  Dietary issues and exercise activities discussed: Current Exercise Habits: Home exercise routine, Type of exercise: strength training/weights, Time (Minutes): 30, Frequency (Times/Week): 2, Weekly Exercise (Minutes/Week): 60, Intensity: Moderate  Diet (meal preparation, eat out, water intake, caffeinated beverages, dairy products, fruits and vegetables): high salt. Meals vary. Likes to eat at [place] water throughout the day.      Goals    . Improve quality of sleep      Depression Screen PHQ 2/9 Scores 07/29/2016 08/05/2015 08/05/2015 06/18/2014  PHQ - 2 Score 2 2 0 2  PHQ- 9 Score 12 8 - 11    Fall Risk Fall Risk  07/29/2016 08/05/2015 08/05/2015 08/12/2013  Falls in the past year? No No No No    Cognitive Function: MMSE - Mini Mental State Exam 07/29/2016  Orientation to time 5  Orientation to Place 5  Registration 3  Attention/ Calculation 2  Recall 1  Language- name 2 objects 2  Language- repeat 1  Language- follow 3 step command 3  Language- read & follow  direction 1  Write a sentence 1  Copy design 1  Total score 25        Screening Tests Health Maintenance  Topic Date Due  . ZOSTAVAX  02/02/2017 (Originally 06/12/2002)  . PNA vac Low Risk Adult (1 of 2 - PCV13) 02/02/2017 (Originally 06/13/2007)  . TETANUS/TDAP  08/04/2024  . COLONOSCOPY  08/11/2025  . INFLUENZA VACCINE  Completed        Plan:    Schedule follow-up w/ PCP to discuss Cialis and memory concerns.  Chest pain-discussed w/ PCP, pt schedule for acute visit on Monday. Discussed red flags that should prompt emergency assessment sooner. Patient verbalized understanding of instructions.   During the course of the visit Jeffrey Grimes was educated and counseled about the following appropriate screening and preventive services:   Vaccines to include Pneumoccal, Influenza, Hepatitis B, Td, Zostavax, HCV  Colorectal cancer screening  Cardiovascular disease screening  Diabetes screening  Glaucoma screening  Nutrition counseling  Prostate cancer screening  Patient Instructions (the written plan) were given to the patient.   Michiel Cowboy, RN   07/29/2016

## 2016-07-29 NOTE — Assessment & Plan Note (Signed)
Pt is not taking synthroid. PCP aware per last note. Ongoing management per PCP.  Lab Results  Component Value Date   TSH 2.90 02/03/2016

## 2016-07-29 NOTE — Assessment & Plan Note (Signed)
Pt reports night terrors several times weekly x20 years due to PTSD. Reports prior medical treatments have not been effective. At this time he is not taking any medication. He is interested in [employer] provided.

## 2016-07-30 NOTE — Progress Notes (Signed)
Medical screening examination/treatment/procedure(s) were performed by non-physician practitioner and as supervising physician I was immediately available for consultation/collaboration. I agree with above. Elizabeth A Crawford, MD 

## 2016-08-01 ENCOUNTER — Encounter: Payer: Self-pay | Admitting: Internal Medicine

## 2016-08-01 ENCOUNTER — Other Ambulatory Visit (INDEPENDENT_AMBULATORY_CARE_PROVIDER_SITE_OTHER): Payer: Medicare Other

## 2016-08-01 ENCOUNTER — Ambulatory Visit (INDEPENDENT_AMBULATORY_CARE_PROVIDER_SITE_OTHER): Payer: Medicare Other | Admitting: Internal Medicine

## 2016-08-01 VITALS — BP 144/78 | HR 70 | Temp 98.5°F | Resp 12 | Ht 65.0 in | Wt 169.0 lb

## 2016-08-01 DIAGNOSIS — E039 Hypothyroidism, unspecified: Secondary | ICD-10-CM | POA: Diagnosis not present

## 2016-08-01 DIAGNOSIS — R079 Chest pain, unspecified: Secondary | ICD-10-CM | POA: Diagnosis not present

## 2016-08-01 DIAGNOSIS — I10 Essential (primary) hypertension: Secondary | ICD-10-CM | POA: Diagnosis not present

## 2016-08-01 DIAGNOSIS — R0782 Intercostal pain: Secondary | ICD-10-CM

## 2016-08-01 DIAGNOSIS — E538 Deficiency of other specified B group vitamins: Secondary | ICD-10-CM

## 2016-08-01 LAB — CBC
HCT: 42.1 % (ref 39.0–52.0)
Hemoglobin: 13.9 g/dL (ref 13.0–17.0)
MCHC: 33.1 g/dL (ref 30.0–36.0)
MCV: 95.6 fl (ref 78.0–100.0)
Platelets: 178 10*3/uL (ref 150.0–400.0)
RBC: 4.41 Mil/uL (ref 4.22–5.81)
RDW: 15.7 % — ABNORMAL HIGH (ref 11.5–15.5)
WBC: 4.2 10*3/uL (ref 4.0–10.5)

## 2016-08-01 NOTE — Patient Instructions (Signed)
The EKG of the heart looks normal.   It is okay to use the aspirin for the pain.  We are checking the labs today and will call you back with the results.

## 2016-08-01 NOTE — Progress Notes (Signed)
   Subjective:    Patient ID: Jeffrey Grimes, male    DOB: 1941-05-29, 76 y.o.   MRN: [id number]  HPI The patient is a 76 YO man coming in for chest pains. Started with a cold about 2 weeks ago. The pain is left side of his chest and hurts with deep breathing or coughing. No SOB and other cold symptoms are gone except rare cough. He is able to make it hurt by pushing on it. Not worse with exertion. Able to walk up a flight of stairs without stopping. He has been taking some aspirin for it and this has been helping.  Also has not been seen in some time and needs follow up on his thyroid (went off his meds last year and having some dry skin, no constipation or diarrhea) and his blood pressure (not on meds now, watching and has been borderline for some time).   Review of Systems  Constitutional: Negative for activity change, appetite change, chills, fatigue, fever and unexpected weight change.  HENT: Negative.   Eyes: Negative.   Respiratory: Positive for cough. Negative for chest tightness, shortness of breath and wheezing.   Cardiovascular: Positive for chest pain. Negative for palpitations and leg swelling.  Gastrointestinal: Negative.   Musculoskeletal: Negative.   Skin: Negative.   Neurological: Negative.   Psychiatric/Behavioral: Negative.       Objective:   Physical Exam  Constitutional: He is oriented to person, place, and time. He appears well-developed and well-nourished.  HENT:  Head: Normocephalic and atraumatic.  Right Ear: External ear normal.  Left Ear: External ear normal.  Eyes: EOM are normal.  Neck: Normal range of motion. No thyromegaly present.  Cardiovascular: Normal rate and regular rhythm.   No murmur heard. Pulmonary/Chest: Effort normal and breath sounds normal. No respiratory distress. He has no wheezes. He has no rales. He exhibits tenderness.  Pain reproducible on palpation over the left chest wall  Abdominal: Soft. He exhibits no distension. There is no  tenderness. There is no rebound.  Neurological: He is alert and oriented to person, place, and time.  Skin: Skin is warm and dry.   Vitals:   08/01/16 1435  BP: (!) 144/78  Pulse: 70  Resp: 12  Temp: 98.5 F (36.9 C)  TempSrc: Oral  SpO2: 99%  Weight: 169 lb (76.7 kg)  Height: 5\' 5"  (1.651 m)   EKG: Rate 65, normal axis, normal intervals, sinus, no st or t wave changes. No change when compared to 2015 EKG.     Assessment & Plan:

## 2016-08-01 NOTE — Progress Notes (Signed)
Pre visit review using our clinic review tool, if applicable. No additional management support is needed unless otherwise documented below in the visit note. 

## 2016-08-02 LAB — VITAMIN B12: Vitamin B-12: 328 pg/mL (ref 211–911)

## 2016-08-02 LAB — T4, FREE: FREE T4: 0.53 ng/dL — AB (ref 0.60–1.60)

## 2016-08-02 LAB — TSH: TSH: 2.79 u[IU]/mL (ref 0.35–4.50)

## 2016-08-05 DIAGNOSIS — R079 Chest pain, unspecified: Secondary | ICD-10-CM | POA: Insufficient documentation

## 2016-08-05 NOTE — Assessment & Plan Note (Signed)
No check in some time and checking TSH and free T4 and he is off thyroid meds for about 1 year. Adjust is needed.

## 2016-08-05 NOTE — Assessment & Plan Note (Signed)
BP at goal off meds and will continue to monitor.

## 2016-08-05 NOTE — Assessment & Plan Note (Signed)
Likely due to recent cold and reproducible on exam. Okay to use aspirin for the pain. EKG without changes on exam. If recurrent or does not resolve will refer to cardiology for possible stress test.

## 2016-08-22 ENCOUNTER — Ambulatory Visit (INDEPENDENT_AMBULATORY_CARE_PROVIDER_SITE_OTHER): Payer: Medicare Other | Admitting: Psychology

## 2016-08-22 DIAGNOSIS — F411 Generalized anxiety disorder: Secondary | ICD-10-CM

## 2016-08-23 ENCOUNTER — Encounter: Payer: Self-pay | Admitting: Podiatry

## 2016-08-23 ENCOUNTER — Ambulatory Visit (INDEPENDENT_AMBULATORY_CARE_PROVIDER_SITE_OTHER): Payer: Medicare Other | Admitting: Podiatry

## 2016-08-23 DIAGNOSIS — M79672 Pain in left foot: Secondary | ICD-10-CM | POA: Diagnosis not present

## 2016-08-23 DIAGNOSIS — Q828 Other specified congenital malformations of skin: Secondary | ICD-10-CM

## 2016-08-23 DIAGNOSIS — L600 Ingrowing nail: Secondary | ICD-10-CM

## 2016-08-23 DIAGNOSIS — M79671 Pain in right foot: Secondary | ICD-10-CM | POA: Diagnosis not present

## 2016-08-23 DIAGNOSIS — B351 Tinea unguium: Secondary | ICD-10-CM

## 2016-08-23 DIAGNOSIS — M21969 Unspecified acquired deformity of unspecified lower leg: Secondary | ICD-10-CM

## 2016-08-23 NOTE — Patient Instructions (Signed)
Seen for painful calluses and hypertrophic nails. All calluses and nails debrided. Return in 3 months or as needed.

## 2016-08-23 NOTE — Progress Notes (Signed)
SUBJECTIVE: 75y.o. year old male presents complaining of painful calluses and nails on both feet. Patient requests trimming. Both big toe nails are painful to walk.  OBJECTIVE: DERMATOLOGIC EXAMINATION: Thick hypertrophic nails x 10. Ingrown hallucal nails without open skin or drainage. Multiple plantar Calluses ball of both feet, symptomatic. VASCULAR EXAMINATION OF LOWER LIMBS: Pedal pulses: All pedal pulses are palpable with normal pulsation.  NEUROLOGIC EXAMINATION OF THE LOWER LIMBS: All epicritic and tactile sensations grossly intact bilateral.  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   ASSESSMENT: Keratoderma plantar bilateral, painful.  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  Onychocryptosis both great toes. Bilateral bunion with deformed metatarsal.  Pain in lower limb.   PLAN: All nails and calluses debrided.  May return in 2month or sooner if need 

## 2016-08-25 ENCOUNTER — Encounter: Payer: Self-pay | Admitting: Internal Medicine

## 2016-08-25 ENCOUNTER — Ambulatory Visit (INDEPENDENT_AMBULATORY_CARE_PROVIDER_SITE_OTHER): Payer: Medicare Other | Admitting: Internal Medicine

## 2016-08-25 DIAGNOSIS — F32A Depression, unspecified: Secondary | ICD-10-CM

## 2016-08-25 DIAGNOSIS — F329 Major depressive disorder, single episode, unspecified: Secondary | ICD-10-CM

## 2016-08-25 MED ORDER — BUPROPION HCL ER (XL) 150 MG PO TB24: 150.0000 mg | ORAL_TABLET | Freq: Every day | ORAL | 6 refills | Status: DC

## 2016-08-25 NOTE — Assessment & Plan Note (Signed)
Rx for wellbutrin (as it is unclear which he took previously will avoid SSRI to start). He will contact us in about 3-4 weeks to let us know if we need dose or medicine adjustment. He will continue with cbt as well to help more. If any SI/HI or severe side effects he will call immediately.

## 2016-08-25 NOTE — Progress Notes (Signed)
Pre visit review using our clinic review tool, if applicable. No additional management support is needed unless otherwise documented below in the visit note. 

## 2016-08-25 NOTE — Progress Notes (Signed)
   Subjective:    Patient ID: Jeffrey Grimes, male    DOB: January 28, 1941, 76 y.o.   MRN: [id number]  HPI The patient is a 76 YO man coming in at the advice of his psychologist to see if he might benefit from a depression medicine. He has seen them once and they felt that his depression was severe enough to warrant medicine too. He has been treated with medicine a long time ago (in the 80s) and had some bad side effects. He cannot recall the names of those medicines. He denies SI/HI at this time. He will continue to see the psychologist. He is willing to trying a medicine. Mostly for depression, he denies anxiety symptoms. Some lack of sleep, lack of motivation, guilt about things.   Review of Systems  Constitutional: Positive for activity change. Negative for appetite change, chills, fatigue, fever and unexpected weight change.  Respiratory: Negative.   Cardiovascular: Negative.   Gastrointestinal: Negative.   Musculoskeletal: Negative.   Skin: Negative.   Psychiatric/Behavioral: Positive for decreased concentration, dysphoric mood and sleep disturbance. Negative for agitation, behavioral problems, hallucinations, self-injury and suicidal ideas. The patient is not nervous/anxious and is not hyperactive.       Objective:   Physical Exam  Constitutional: He is oriented to person, place, and time. He appears well-developed and well-nourished.  HENT:  Head: Normocephalic and atraumatic.  Eyes: EOM are normal.  Neck: Normal range of motion.  Cardiovascular: Normal rate and regular rhythm.   Pulmonary/Chest: Effort normal. No respiratory distress. He has no wheezes. He has no rales.  Abdominal: Soft.  Musculoskeletal: He exhibits no edema.  Neurological: He is alert and oriented to person, place, and time.  Skin: Skin is warm and dry.  Psychiatric:  Slightly flat affect   Vitals:   08/25/16 0956  BP: 130/70  Pulse: 70  Temp: 98.3 F (36.8 C)  TempSrc: Oral  SpO2: 100%  Weight: 170 lb  (77.1 kg)  Height: 5\' 5"  (1.651 m)      Assessment & Plan:

## 2016-08-25 NOTE — Patient Instructions (Signed)
We have sent in the wellbutrin. This is a once a day medicine to help with depression. It can take 3 weeks or so to kick in and start helping.   Around 3 weeks from now we want you to call us or send a mychart message letting us know how you are doing and if we need to make changes with the medicine.

## 2016-08-29 ENCOUNTER — Ambulatory Visit (INDEPENDENT_AMBULATORY_CARE_PROVIDER_SITE_OTHER): Payer: Medicare Other | Admitting: Psychology

## 2016-08-29 DIAGNOSIS — F331 Major depressive disorder, recurrent, moderate: Secondary | ICD-10-CM

## 2016-09-07 ENCOUNTER — Ambulatory Visit (INDEPENDENT_AMBULATORY_CARE_PROVIDER_SITE_OTHER): Payer: Medicare Other | Admitting: Psychology

## 2016-09-07 DIAGNOSIS — F411 Generalized anxiety disorder: Secondary | ICD-10-CM

## 2016-09-12 ENCOUNTER — Encounter: Payer: Self-pay | Admitting: Internal Medicine

## 2016-09-14 ENCOUNTER — Ambulatory Visit (INDEPENDENT_AMBULATORY_CARE_PROVIDER_SITE_OTHER): Payer: Medicare Other | Admitting: Psychology

## 2016-09-14 DIAGNOSIS — F411 Generalized anxiety disorder: Secondary | ICD-10-CM | POA: Diagnosis not present

## 2016-09-21 ENCOUNTER — Ambulatory Visit (INDEPENDENT_AMBULATORY_CARE_PROVIDER_SITE_OTHER): Payer: Medicare Other | Admitting: Psychology

## 2016-09-21 DIAGNOSIS — F411 Generalized anxiety disorder: Secondary | ICD-10-CM

## 2016-09-28 ENCOUNTER — Ambulatory Visit (INDEPENDENT_AMBULATORY_CARE_PROVIDER_SITE_OTHER): Payer: Medicare Other | Admitting: Psychology

## 2016-09-28 DIAGNOSIS — F411 Generalized anxiety disorder: Secondary | ICD-10-CM

## 2016-10-12 ENCOUNTER — Ambulatory Visit (INDEPENDENT_AMBULATORY_CARE_PROVIDER_SITE_OTHER): Payer: Medicare Other | Admitting: Psychology

## 2016-10-12 DIAGNOSIS — F411 Generalized anxiety disorder: Secondary | ICD-10-CM | POA: Diagnosis not present

## 2016-10-18 ENCOUNTER — Ambulatory Visit (INDEPENDENT_AMBULATORY_CARE_PROVIDER_SITE_OTHER): Payer: Medicare Other | Admitting: Psychology

## 2016-10-18 DIAGNOSIS — F411 Generalized anxiety disorder: Secondary | ICD-10-CM | POA: Diagnosis not present

## 2016-10-19 ENCOUNTER — Ambulatory Visit: Payer: Medicare Other | Admitting: Psychology

## 2016-10-20 ENCOUNTER — Ambulatory Visit (INDEPENDENT_AMBULATORY_CARE_PROVIDER_SITE_OTHER): Payer: Medicare Other | Admitting: Podiatry

## 2016-10-20 ENCOUNTER — Ambulatory Visit: Payer: Medicare Other | Admitting: Podiatry

## 2016-10-20 ENCOUNTER — Encounter: Payer: Self-pay | Admitting: Podiatry

## 2016-10-20 DIAGNOSIS — M79671 Pain in right foot: Secondary | ICD-10-CM

## 2016-10-20 DIAGNOSIS — M79672 Pain in left foot: Secondary | ICD-10-CM | POA: Diagnosis not present

## 2016-10-20 DIAGNOSIS — B351 Tinea unguium: Secondary | ICD-10-CM | POA: Diagnosis not present

## 2016-10-20 DIAGNOSIS — Q828 Other specified congenital malformations of skin: Secondary | ICD-10-CM

## 2016-10-20 DIAGNOSIS — L600 Ingrowing nail: Secondary | ICD-10-CM

## 2016-10-20 NOTE — Patient Instructions (Signed)
Seen for hypertrophic nails and calluses. All nails and calluses debrided. Return in 3 months or as needed.  

## 2016-10-20 NOTE — Progress Notes (Signed)
SUBJECTIVE: 75y.o. year old male presents complaining of painful calluses and nails on both feet. Patient requests trimming. Both big toe nails are painful to walk.  OBJECTIVE: DERMATOLOGIC EXAMINATION: Thick hypertrophic nails x 10. Ingrown hallucal nails without open skin or drainage. Multiple plantar Calluses ball of both feet, symptomatic. VASCULAR EXAMINATION OF LOWER LIMBS: Pedal pulses: All pedal pulses are palpable with normal pulsation.  NEUROLOGIC EXAMINATION OF THE LOWER LIMBS: All epicritic and tactile sensations grossly intact bilateral.  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   ASSESSMENT: Keratoderma plantar bilateral, painful.  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  Onychocryptosis both great toes. Bilateral bunion with deformed metatarsal.  Pain in lower limb.   PLAN: All nails and calluses debrided.  May return in 43month or sooner if need

## 2016-10-26 ENCOUNTER — Ambulatory Visit (INDEPENDENT_AMBULATORY_CARE_PROVIDER_SITE_OTHER): Payer: Medicare Other | Admitting: Psychology

## 2016-10-26 DIAGNOSIS — F411 Generalized anxiety disorder: Secondary | ICD-10-CM

## 2016-11-02 ENCOUNTER — Ambulatory Visit (INDEPENDENT_AMBULATORY_CARE_PROVIDER_SITE_OTHER): Payer: Medicare Other | Admitting: Psychology

## 2016-11-02 DIAGNOSIS — F411 Generalized anxiety disorder: Secondary | ICD-10-CM

## 2016-11-16 ENCOUNTER — Ambulatory Visit (INDEPENDENT_AMBULATORY_CARE_PROVIDER_SITE_OTHER): Payer: Medicare Other | Admitting: Psychology

## 2016-11-16 DIAGNOSIS — F411 Generalized anxiety disorder: Secondary | ICD-10-CM

## 2016-11-22 IMAGING — CT CT ABD-PELV W/ CM
2 of 5 series · 16 of 46 positions shown, 18 images · IV contrast (Omni 300)
Comparison: None.

CLINICAL DATA: Intermittent constipation, left lower quadrant pain

EXAM:
CT ABDOMEN AND PELVIS WITH CONTRAST
TECHNIQUE: Multidetector CT imaging of the abdomen and pelvis was performed
using the standard protocol following bolus administration of
intravenous contrast.
CONTRAST:  100mL OMNIPAQUE IOHEXOL 300 MG/ML  SOLN

[Series 2: a/p w/ 5mm · axial · 0.76mm/px · z∈[+543,+943]mm · 13 of 91 slices shown, 15 images]
[im 6/91  soft-tissue]
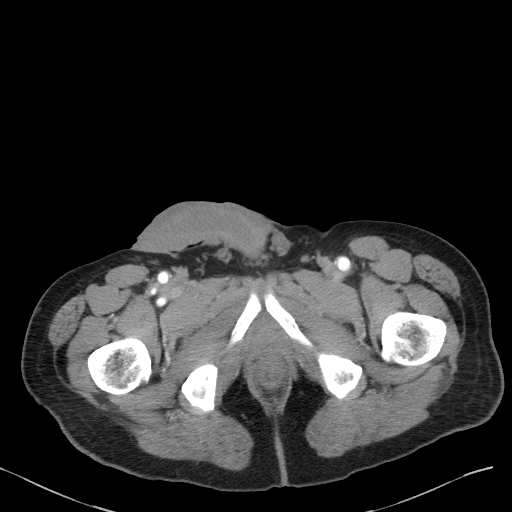
[im 6/91  bone]
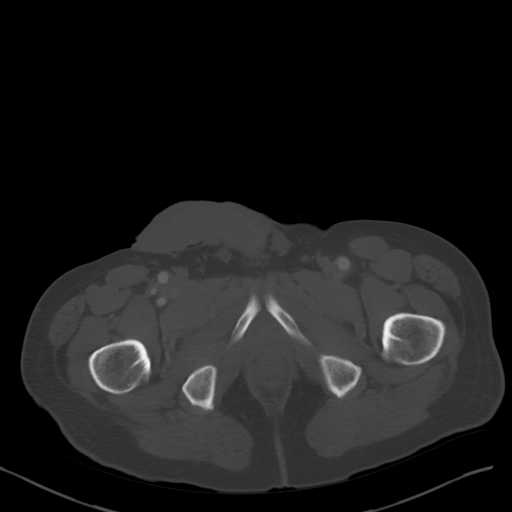
[im 11/91  soft-tissue]
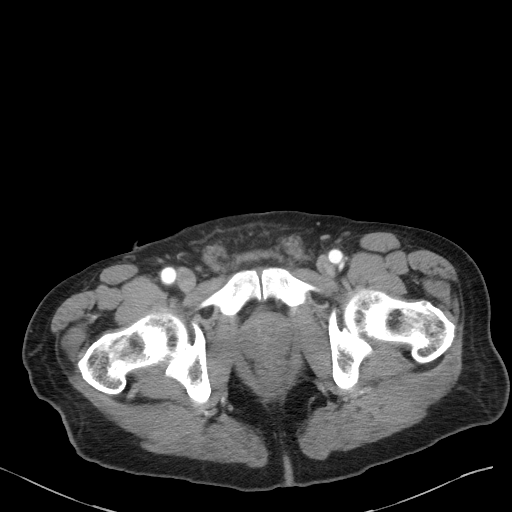
[im 21/91  soft-tissue]
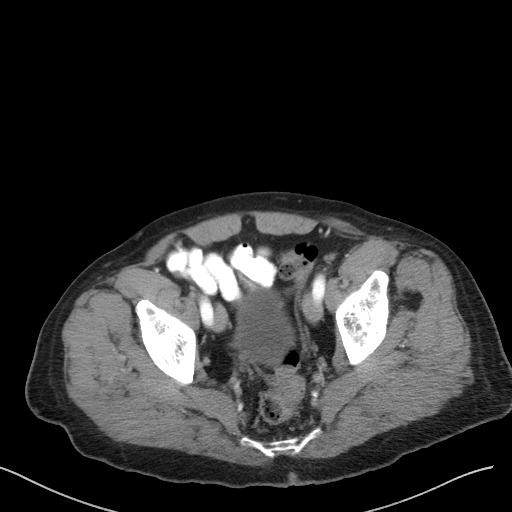
[im 26/91  soft-tissue]
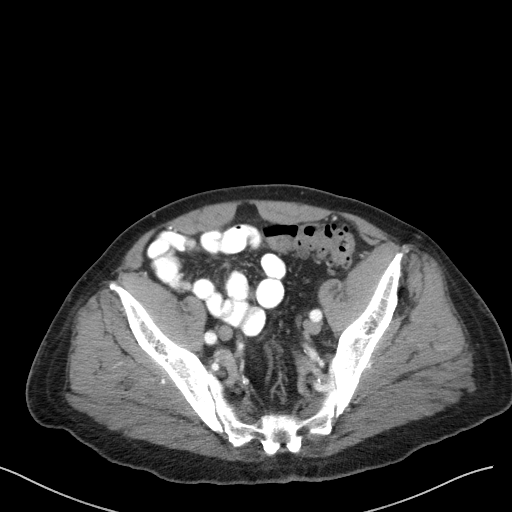
[im 31/91  soft-tissue]
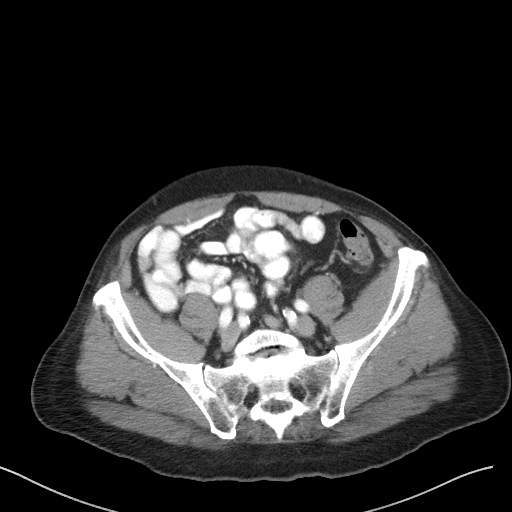
[im 41/91  soft-tissue]
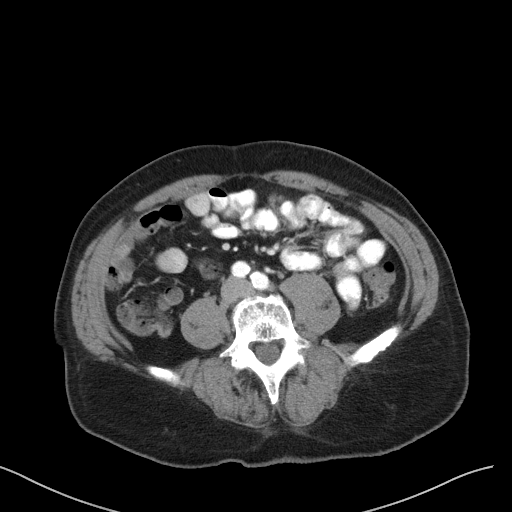
[im 46/91  soft-tissue]
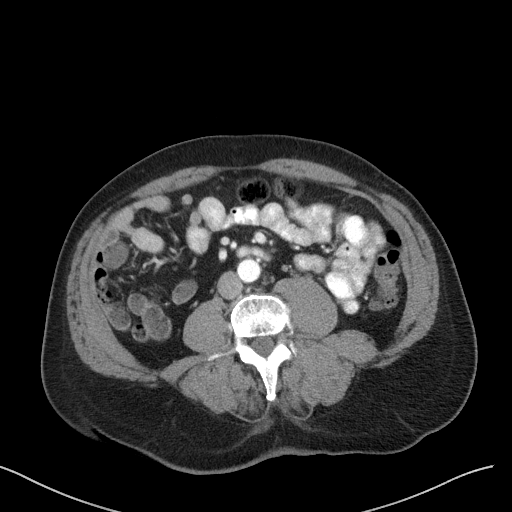
[im 51/91  soft-tissue]
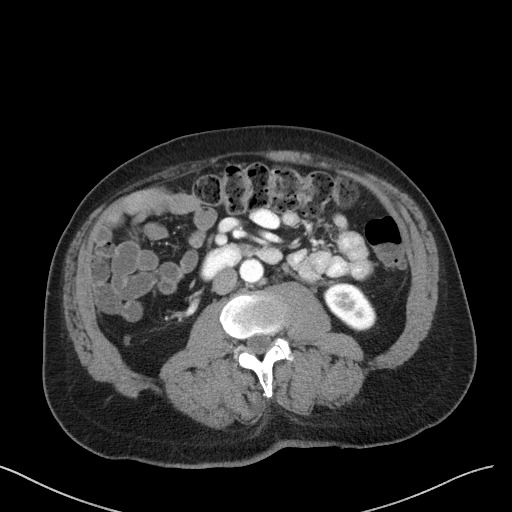
[im 61/91  soft-tissue]
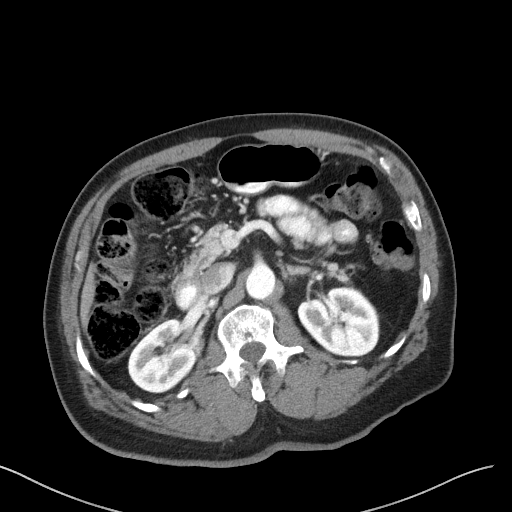
[im 61/91  bone]
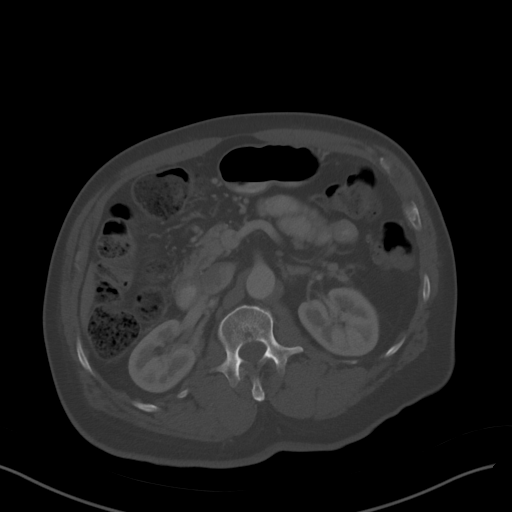
[im 66/91  soft-tissue]
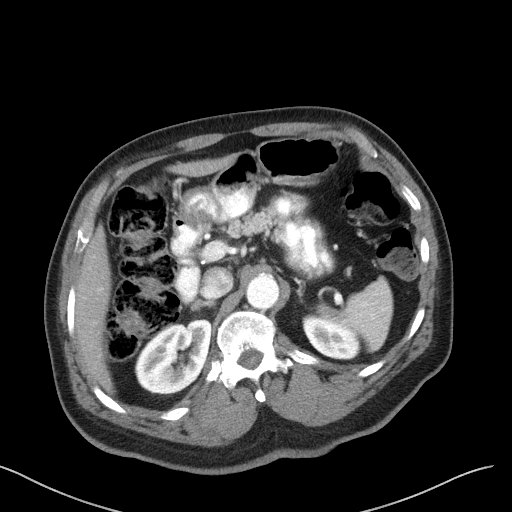
[im 71/91  soft-tissue]
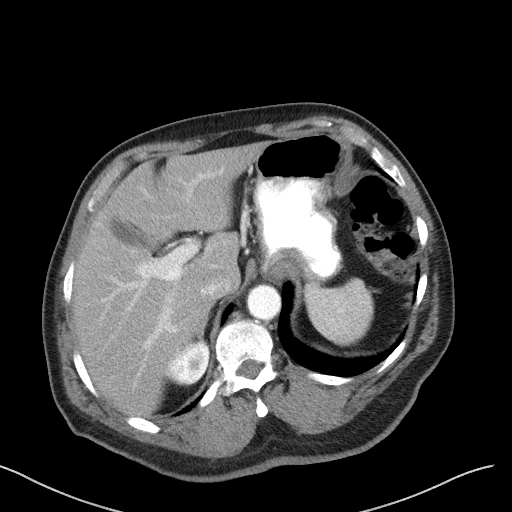
[im 81/91  soft-tissue]
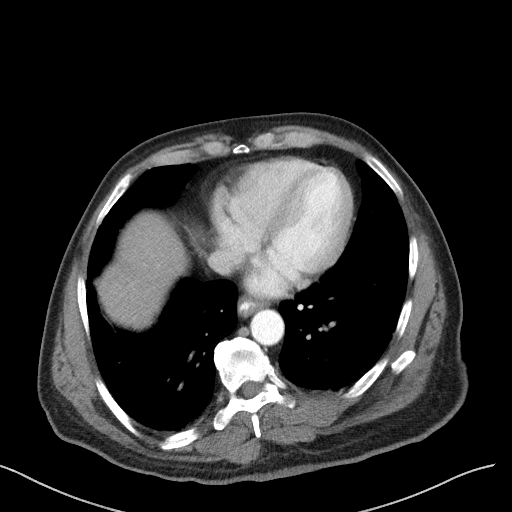
[im 86/91  soft-tissue]
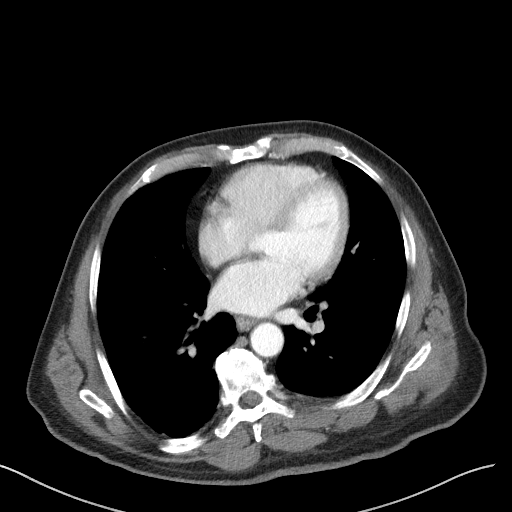

[Series 5: a/p w/ cor · coronal · 0.89mm/px · 3 of 131 slices shown]
[im 44/131  soft-tissue]
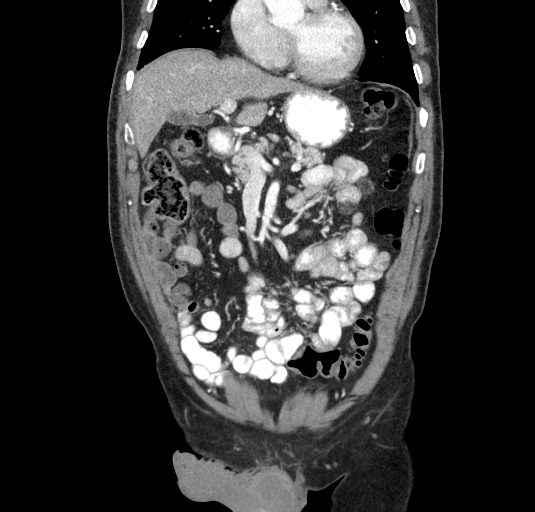
[im 58/131  soft-tissue]
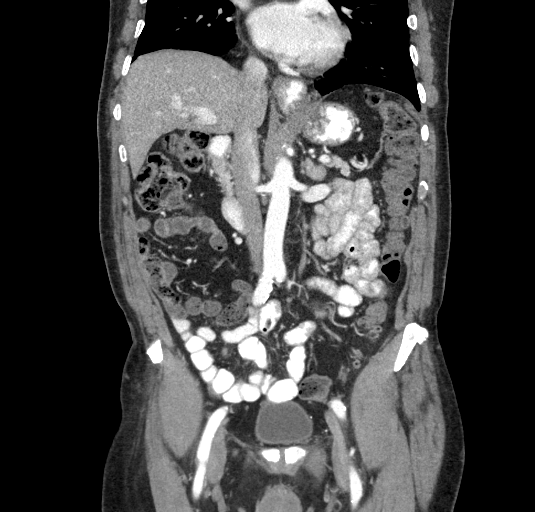
[im 73/131  soft-tissue]
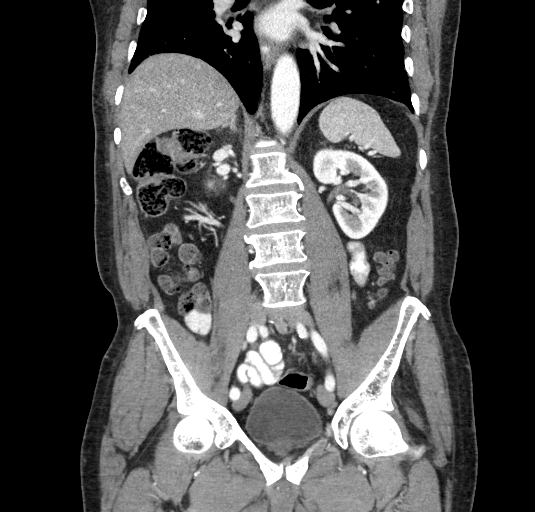

[16 of 46 positions shown; findings below may reference images not displayed]

FINDINGS: Sagittal images of the spine shows mild degenerative changes
thoracolumbar spine. Significant disc space flattening with vacuum
disc phenomenon at L5-S1 level.

There is mild fatty infiltration of the liver. No focal hepatic
mass. Small hiatal hernia. Mild thickening of distal esophageal wall
suspicious for gastroesophageal reflux. The pancreas, spleen and
adrenal glands are unremarkable. Kidneys are symmetrical in size and
enhancement. There is mild dextroscoliosis of lumbar spine. No
hydronephrosis or hydroureter.

Delayed renal images shows bilateral renal symmetrical excretion.
Bilateral visualized proximal ureter is unremarkable.

There is no small bowel obstruction. No ascites or free air. No
adenopathy. Moderate stool noted throughout the colon. No pericecal
inflammation. There is some fecal like material within terminal
ileum probable incompetent ileocecal valve.

Descending colon and sigmoid colon diverticula are noted. There is
no evidence of acute diverticulitis. No distal colonic obstruction.
Prostate gland and seminal vesicles are unremarkable. The urinary
bladder is unremarkable.
IMPRESSION: 1. There is no evidence of acute inflammatory process within
abdomen. Moderate stool within colon.
2. Colonic diverticula are noted sigmoid colon and descending colon.
No evidence of acute diverticulitis.
3. No pericecal inflammation.  Question incompetent ileocecal valve.
4. No small bowel obstruction.
5. No hydronephrosis or hydroureter.

## 2016-11-23 ENCOUNTER — Ambulatory Visit: Payer: Medicare Other | Admitting: Psychology

## 2016-12-20 ENCOUNTER — Encounter: Payer: Self-pay | Admitting: Podiatry

## 2016-12-20 ENCOUNTER — Ambulatory Visit (INDEPENDENT_AMBULATORY_CARE_PROVIDER_SITE_OTHER): Payer: Medicare Other | Admitting: Podiatry

## 2016-12-20 VITALS — BP 170/82 | HR 72

## 2016-12-20 DIAGNOSIS — B351 Tinea unguium: Secondary | ICD-10-CM

## 2016-12-20 DIAGNOSIS — Q828 Other specified congenital malformations of skin: Secondary | ICD-10-CM | POA: Diagnosis not present

## 2016-12-20 DIAGNOSIS — M79672 Pain in left foot: Secondary | ICD-10-CM

## 2016-12-20 DIAGNOSIS — M79671 Pain in right foot: Secondary | ICD-10-CM | POA: Diagnosis not present

## 2016-12-20 DIAGNOSIS — L600 Ingrowing nail: Secondary | ICD-10-CM

## 2016-12-20 NOTE — Patient Instructions (Signed)
Seen for hypertrophic nails and painful calluses. All nails and calluses debrided. Return in 2 months or as needed.

## 2016-12-20 NOTE — Progress Notes (Signed)
SUBJECTIVE: 75y.o. year old male presents complaining of painful calluses and nails on both feet. Patient requests trimming. Both big toe nails are painful to walk.  OBJECTIVE: DERMATOLOGIC EXAMINATION: Thick hypertrophic nails x 10. Ingrown hallucal nails without open skin or drainage. Multiple plantar Calluses ball of both feet, symptomatic. VASCULAR EXAMINATION OF LOWER LIMBS: Pedal pulses: All pedal pulses are palpable with normal pulsation.  NEUROLOGIC EXAMINATION OF THE LOWER LIMBS: All epicritic and tactile sensations grossly intact bilateral.  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   ASSESSMENT: Keratoderma plantar bilateral, painful.  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  Onychocryptosis both great toes. Bilateral bunion with deformed metatarsal.  Pain in lower limb.   PLAN: All nails and calluses debrided.  May return in 61month or sooner if need

## 2017-02-14 ENCOUNTER — Ambulatory Visit (INDEPENDENT_AMBULATORY_CARE_PROVIDER_SITE_OTHER): Payer: Medicare Other | Admitting: Podiatry

## 2017-02-14 ENCOUNTER — Encounter: Payer: Self-pay | Admitting: Podiatry

## 2017-02-14 DIAGNOSIS — M79672 Pain in left foot: Secondary | ICD-10-CM | POA: Diagnosis not present

## 2017-02-14 DIAGNOSIS — Q828 Other specified congenital malformations of skin: Secondary | ICD-10-CM | POA: Diagnosis not present

## 2017-02-14 DIAGNOSIS — B351 Tinea unguium: Secondary | ICD-10-CM

## 2017-02-14 DIAGNOSIS — M79671 Pain in right foot: Secondary | ICD-10-CM | POA: Diagnosis not present

## 2017-02-14 NOTE — Progress Notes (Signed)
SUBJECTIVE: 75y.o. year old male presents complaining of painful calluses and nails on both feet. Patient requests trimming. Both big toe nails are painful to walk.  OBJECTIVE: DERMATOLOGIC EXAMINATION: Thick hypertrophic nails x 10. Ingrown hallucal nails without open skin or drainage. Multiple plantar Calluses ball of both feet, symptomatic. VASCULAR EXAMINATION OF LOWER LIMBS: Pedal pulses: All pedal pulses are palpable with normal pulsation.  NEUROLOGIC EXAMINATION OF THE LOWER LIMBS: All epicritic and tactile sensations grossly intact bilateral.  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   ASSESSMENT: Keratoderma plantar bilateral, painful.  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  Onychocryptosis both great toes. Bilateral bunion with deformed metatarsal.  Pain in lower limb.   PLAN: All nails and calluses debrided.  May return in 94month or sooner if need

## 2017-02-14 NOTE — Patient Instructions (Signed)
Seen for hypertrophic nails and painful calluses. All nails and calluses debrided. Return in 2 months.

## 2017-02-21 ENCOUNTER — Ambulatory Visit: Payer: Medicare Other | Admitting: Podiatry

## 2017-04-18 ENCOUNTER — Ambulatory Visit (INDEPENDENT_AMBULATORY_CARE_PROVIDER_SITE_OTHER): Payer: Medicare Other | Admitting: Podiatry

## 2017-04-18 ENCOUNTER — Encounter: Payer: Self-pay | Admitting: Podiatry

## 2017-04-18 DIAGNOSIS — Q828 Other specified congenital malformations of skin: Secondary | ICD-10-CM | POA: Diagnosis not present

## 2017-04-18 DIAGNOSIS — L600 Ingrowing nail: Secondary | ICD-10-CM

## 2017-04-18 DIAGNOSIS — B351 Tinea unguium: Secondary | ICD-10-CM | POA: Diagnosis not present

## 2017-04-18 DIAGNOSIS — M79671 Pain in right foot: Secondary | ICD-10-CM

## 2017-04-18 DIAGNOSIS — M79672 Pain in left foot: Secondary | ICD-10-CM

## 2017-04-18 NOTE — Patient Instructions (Signed)
Seen for hypertrophic nails and calluses. All nails and ingrown nails, calluses debrided. Return in 3 months or as needed.

## 2017-04-18 NOTE — Progress Notes (Signed)
Subjective: 76 y.o. year old male patient presents complaining of painful nails and calluses.  Patient requests toe nails, corns and calluses trimmed.  No new problems noted.  Objective: Dermatologic: Thick yellow deformed nails x 10.  Symptomatic plantar calluses bilateral. Both hallucal nails have ingrown nails on both borders without broken skin. Vascular: Pedal pulses are all palpable. Orthopedic: Severe hallux valgus with bunion deformity L>R. Lesser digital contracture bilateral. Neurologic: All epicritic and tactile sensations grossly intact.  Assessment: Dystrophic mycotic nails x 10. Ingrown hallucal nails bilateral.  Treatment: All mycotic nails, ingrown nails, and calluses debrided.  Return in 3 months or as needed.

## 2017-07-19 ENCOUNTER — Encounter: Payer: Self-pay | Admitting: Podiatry

## 2017-07-19 ENCOUNTER — Ambulatory Visit (INDEPENDENT_AMBULATORY_CARE_PROVIDER_SITE_OTHER): Payer: Medicare Other | Admitting: Podiatry

## 2017-07-19 DIAGNOSIS — M79672 Pain in left foot: Secondary | ICD-10-CM

## 2017-07-19 DIAGNOSIS — L600 Ingrowing nail: Secondary | ICD-10-CM

## 2017-07-19 DIAGNOSIS — B351 Tinea unguium: Secondary | ICD-10-CM | POA: Diagnosis not present

## 2017-07-19 DIAGNOSIS — M79671 Pain in right foot: Secondary | ICD-10-CM

## 2017-07-19 NOTE — Progress Notes (Signed)
Subjective: 77 y.o. year old male patient presents complaining of painful nails. Patient requests toe nails, corns and calluses trimmed.   Objective: Dermatologic: Thick yellow deformed nails x 10. Ingrown hallucal nails on both great toes. Painful plantar calluses bilateral. Vascular: Pedal pulses are all palpable. Orthopedic: Contracted lesser digits bilateral. 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  Neurologic: All epicritic and tactile sensations grossly intact.  Assessment: Dystrophic mycotic nails x 10. Painful porokeratosis plantar bilateral. Ingrown hallucal nails bilateral. Painful feet.  Treatment: All mycotic nails, corns, calluses debrided.  Return in 3 months or as needed.

## 2017-07-19 NOTE — Patient Instructions (Signed)
Seen for hypertrophic nails and painful calluses. All nails and calluses debrided. Return in 3 months or as needed.  

## 2017-10-13 ENCOUNTER — Encounter: Payer: Self-pay | Admitting: Internal Medicine

## 2017-10-17 ENCOUNTER — Ambulatory Visit (INDEPENDENT_AMBULATORY_CARE_PROVIDER_SITE_OTHER): Payer: Medicare Other | Admitting: Podiatry

## 2017-10-17 ENCOUNTER — Encounter: Payer: Self-pay | Admitting: Podiatry

## 2017-10-17 DIAGNOSIS — M79671 Pain in right foot: Secondary | ICD-10-CM

## 2017-10-17 DIAGNOSIS — B351 Tinea unguium: Secondary | ICD-10-CM | POA: Diagnosis not present

## 2017-10-17 DIAGNOSIS — M79672 Pain in left foot: Secondary | ICD-10-CM

## 2017-10-17 DIAGNOSIS — L600 Ingrowing nail: Secondary | ICD-10-CM | POA: Diagnosis not present

## 2017-10-17 NOTE — Progress Notes (Signed)
Subjective: 77 y.o. year old male patient presents complaining of painful nails and calluses. Patient requests toe nails, corns and calluses trimmed.   Objective: Dermatologic: Thick yellow deformed nails x 10. Ingrown hallucal nails painful on both feet.  Callus under ball of both feet painful. Sub 4 left, sub 2 and 5 right. Vascular: Pedal pulses are all palpable. Orthopedic: Contracted lesser digits with severe hallux valgus and bunion L>R. Neurologic: All epicritic and tactile sensations grossly intact.  Assessment: Dystrophic mycotic nails x 10. Painful porokeratosis bilateral. Painful ingrown nails.   Treatment: All mycotic nails, corns, calluses debrided.  Return in 3 months or as needed.

## 2017-10-17 NOTE — Patient Instructions (Signed)
Seen for hypertrophic nails and calluses. All nails and calluses debrided. Return in 3 months or as needed.  

## 2017-12-19 ENCOUNTER — Ambulatory Visit (INDEPENDENT_AMBULATORY_CARE_PROVIDER_SITE_OTHER): Payer: Medicare Other | Admitting: *Deleted

## 2017-12-19 VITALS — BP 143/88 | HR 57 | Resp 18 | Ht 65.0 in | Wt 161.0 lb

## 2017-12-19 DIAGNOSIS — Z0000 Encounter for general adult medical examination without abnormal findings: Secondary | ICD-10-CM | POA: Diagnosis not present

## 2017-12-19 NOTE — Patient Instructions (Addendum)
Jeffrey Grimes. Jeffrey Daniel, MD - [facility] in [place], [place] Address: [address], [place], [place] [address] Phone: [phone]  Continue doing brain stimulating activities (puzzles, reading, adult coloring books, staying active) to keep memory sharp.   Continue to eat heart healthy diet (full of fruits, vegetables, whole grains, lean protein, water--limit salt, fat, and sugar intake) and increase physical activity as tolerated.   [name] , Thank you for taking time to come for your Medicare Wellness Visit. I appreciate your ongoing commitment to your health goals. Please review the following plan we discussed and let me know if I can assist you in the future.   These are the goals we discussed: Goals    . Patient Stated     Increase my physical activity by going to the gym, cut grass, perhaps going to the [place] and walk in the morning.        This is a list of the screening recommended for you and due dates:  Health Maintenance  Topic Date Due  . Pneumonia vaccines (1 of 2 - PCV13) 06/13/2007  . Flu Shot  02/01/2018  . Tetanus Vaccine  08/04/2024  . Colon Cancer Screening  08/11/2025

## 2017-12-19 NOTE — Progress Notes (Signed)
Medical screening examination/treatment/procedure(s) were performed by non-physician practitioner and as supervising physician I was immediately available for consultation/collaboration. I agree with above. Yazlyn Wentzel A Finch Costanzo, MD 

## 2017-12-19 NOTE — Progress Notes (Signed)
Subjective:   Jeffrey Grimes is a 77 y.o. male who presents for Medicare Annual/Subsequent preventive examination.  Review of Systems:  No ROS.  Medicare Wellness Visit. Additional risk factors are reflected in the social history.  Cardiac Risk Factors include: advanced age (>36men, >75 women);hypertension;male gender Sleep patterns: has interrupted sleep, has restless sleep, has frequent nighttime awakenings, gets up 1-2 times nightly to void and sleeps 4-5 hours nightly. Patient reports insomnia issues, discussed recommended sleep tips and stress reduction tips.   Home Safety/Smoke Alarms: Feels safe in home. Smoke alarms in place.  Living environment; residence and Firearm Safety: 1-story house/ trailer, no firearms., Lives alone, no needs for DME, good support system Seat Belt Safety/Bike Helmet: Wears seat belt.      Objective:    Vitals: BP (!) 143/88   Pulse (!) 57   Resp 18   Ht 5\' 5"  (1.651 m)   Wt 161 lb (73 kg)   SpO2 99%   BMI 26.79 kg/m   Body mass index is 26.79 kg/m.  Advanced Directives 12/19/2017 07/29/2016 05/25/2015 06/18/2014  Does Patient Have a Medical Advance Directive? Yes Yes No No  Type of Paramedic of [place];Living will Living will - -  Copy of [place] in Chart? No - copy requested No - copy requested - -  Would patient like information on creating a medical advance directive? - - - No - patient declined information    Tobacco Social History   Tobacco Use  Smoking Status Former Smoker  Smokeless Tobacco Never Used     Counseling given: Not Answered  Past Medical History:  Diagnosis Date  . Anxiety   . Colon polyps   . Constipation   . Depression   . Glaucoma   . Heart murmur   . Hypertension   . Thyroid disease    Past Surgical History:  Procedure Laterality Date  . COLONOSCOPY     done in [place] 2010   Family History  Problem Relation Age of Onset  . Diabetes Mother   .  Hypertension Mother   . Heart disease Mother   . Hypertension Father   . Heart disease Father   . Thyroid disease Sister   . Hypertension Brother   . Diabetes Brother   . Prostate cancer Brother   . Heart disease Brother    Social History   Socioeconomic History  . Marital status: Married    Spouse name: Not on file  . Number of children: 0  . Years of education: Not on file  . Highest education level: Not on file  Occupational History  . Occupation: Retired   Scientific laboratory technician  . Financial resource strain: Not hard at all  . Food insecurity:    Worry: Never true    Inability: Never true  . Transportation needs:    Medical: No    Non-medical: No  Tobacco Use  . Smoking status: Former Research scientist (life sciences)  . Smokeless tobacco: Never Used  Substance and Sexual Activity  . Alcohol use: Yes    Alcohol/week: 0.0 oz    Comment: socially   . Drug use: No  . Sexual activity: Never  Lifestyle  . Physical activity:    Days per week: 2 days    Minutes per session: 50 min  . Stress: Only a little  Relationships  . Social connections:    Talks on phone: More than three times a week    Gets together: More than  three times a week    Attends religious service: More than 4 times per year    Active member of club or organization: Yes    Attends meetings of clubs or organizations: More than 4 times per year    Relationship status: Married  Other Topics Concern  . Not on file  Social History Narrative  . Not on file    Outpatient Encounter Medications as of 12/19/2017  Medication Sig  . bimatoprost (LUMIGAN) 0.01 % SOLN Place 1 drop into both eyes at bedtime.   . Cholecalciferol (VITAMIN D PO) Take 1 capsule by mouth daily.  . COMBIGAN 0.2-0.5 % ophthalmic solution Place 1 drop into both eyes daily.   . Cyanocobalamin (VITAMIN B 12 PO) Take 1,000 mcg by mouth daily.  . timolol (BETIMOL) 0.25 % ophthalmic solution Place 1 drop into both eyes daily.  . [DISCONTINUED] buPROPion (WELLBUTRIN XL)  150 MG 24 hr tablet Take 1 tablet (150 mg total) by mouth daily.   No facility-administered encounter medications on file as of 12/19/2017.     Activities of Daily Living In your present state of health, do you have any difficulty performing the following activities: 12/19/2017  Hearing? N  Vision? N  Difficulty concentrating or making decisions? N  Walking or climbing stairs? N  Dressing or bathing? N  Doing errands, shopping? N  Preparing Food and eating ? N  Using the Toilet? N  In the past six months, have you accidently leaked urine? N  Do you have problems with loss of bowel control? N  Managing your Medications? N  Managing your Finances? N  Housekeeping or managing your Housekeeping? N  Some recent data might be hidden    Patient Care Team: Hoyt Koch, MD as PCP - General (Internal Medicine)   Assessment:   This is a routine wellness examination for Jeffrey Grimes. Physical assessment deferred to PCP.   Exercise Activities and Dietary recommendations Current Exercise Habits: Structured exercise class, Type of exercise: treadmill(stationary bike), Time (Minutes): 55, Frequency (Times/Week): 2, Weekly Exercise (Minutes/Week): 110, Intensity: Mild Diet (meal preparation, eat out, water intake, caffeinated beverages, dairy products, fruits and vegetables): in general, a "healthy" diet  , well balanced   Reviewed heart healthy diet, Encouraged patient to increase daily water and fluid intake.  Goals    . Patient Stated     Increase my physical activity by going to the gym, cut grass, perhaps going to the [place] and walk in the morning.        Fall Risk Fall Risk  12/19/2017 07/29/2016 08/05/2015 08/05/2015 08/12/2013  Falls in the past year? No No No No No  Risk for fall due to : Impaired mobility - - - -    Depression Screen PHQ 2/9 Scores 12/19/2017 07/29/2016 08/05/2015 08/05/2015  PHQ - 2 Score 1 2 2  0  PHQ- 9 Score 6 12 8  -    Cognitive Function MMSE - Mini Mental  State Exam 12/19/2017 07/29/2016  Orientation to time 5 5  Orientation to Place 5 5  Registration 3 3  Attention/ Calculation 4 2  Recall 1 1  Language- name 2 objects 2 2  Language- repeat 1 1  Language- follow 3 step command 3 3  Language- read & follow direction 1 1  Write a sentence 1 1  Copy design 1 1  Total score 27 25        Immunization History  Administered Date(s) Administered  . Influenza,inj,Quad PF,6+ Mos 04/13/2015, 07/29/2016  .  Tdap 08/04/2014   Screening Tests Health Maintenance  Topic Date Due  . PNA vac Low Risk Adult (1 of 2 - PCV13) 06/13/2007  . INFLUENZA VACCINE  02/01/2018  . TETANUS/TDAP  08/04/2024  . COLONOSCOPY  08/11/2025      Plan:   Patient declined pneumonia vaccine as he thinks he had it at the [place]. Patient states he is getting his medical records from the [place] during his next appointment and he will update this facility which vaccinations he has received from the [place].   Continue doing brain stimulating activities (puzzles, reading, adult coloring books, staying active) to keep memory sharp.   Continue to eat heart healthy diet (full of fruits, vegetables, whole grains, lean protein, water--limit salt, fat, and sugar intake) and increase physical activity as tolerated.   I have personally reviewed and noted the following in the patient's chart:   . Medical and social history . Use of alcohol, tobacco or illicit drugs  . Current medications and supplements . Functional ability and status . Nutritional status . Physical activity . Advanced directives . List of other physicians . Vitals . Screenings to include cognitive, depression, and falls . Referrals and appointments  In addition, I have reviewed and discussed with patient certain preventive protocols, quality metrics, and best practice recommendations. A written personalized care plan for preventive services as well as general preventive health recommendations were provided to  patient.     Michiel Cowboy, RN  12/19/2017

## 2018-01-16 ENCOUNTER — Ambulatory Visit: Payer: Self-pay | Admitting: Internal Medicine

## 2018-01-16 ENCOUNTER — Ambulatory Visit (INDEPENDENT_AMBULATORY_CARE_PROVIDER_SITE_OTHER): Payer: Medicare Other | Admitting: Podiatry

## 2018-01-16 ENCOUNTER — Encounter: Payer: Self-pay | Admitting: Podiatry

## 2018-01-16 DIAGNOSIS — M79672 Pain in left foot: Secondary | ICD-10-CM

## 2018-01-16 DIAGNOSIS — M21969 Unspecified acquired deformity of unspecified lower leg: Secondary | ICD-10-CM

## 2018-01-16 DIAGNOSIS — B351 Tinea unguium: Secondary | ICD-10-CM

## 2018-01-16 DIAGNOSIS — M79671 Pain in right foot: Secondary | ICD-10-CM | POA: Diagnosis not present

## 2018-01-16 DIAGNOSIS — Q828 Other specified congenital malformations of skin: Secondary | ICD-10-CM

## 2018-01-16 NOTE — Progress Notes (Signed)
Subjective: 77 y.o. year old male patient presents complaining of painful feet from nails and calluses. He was seen 3 months ago and requests to be seen at least in 2 month. His feet start hurting after one month.  Objective: Dermatologic: Thick yellow deformed nails x 10. Porokeratotic lesions under 4th MPJ left, and under 2nd and 5th MPJ right. Vascular: Pedal pulses are all palpable. Orthopedic: Contracted lesser digits with severe hallux valgus with bunion deformity L>R. Neurologic: All epicritic and tactile sensations grossly intact.  Assessment: Dystrophic mycotic nails x 10. Painful porokeratosis plantar bilateral. Painful feet bilateral.  Treatment: All mycotic nails and painful lesions debrided.  May return in 2 month as per request.

## 2018-01-16 NOTE — Patient Instructions (Signed)
Seen for hypertrophic nails and painful calluses. All nails and calluses debrided. As per request may return in 2 month.

## 2018-01-17 ENCOUNTER — Ambulatory Visit (INDEPENDENT_AMBULATORY_CARE_PROVIDER_SITE_OTHER): Payer: Medicare Other | Admitting: Internal Medicine

## 2018-01-17 ENCOUNTER — Encounter: Payer: Self-pay | Admitting: Internal Medicine

## 2018-01-17 ENCOUNTER — Other Ambulatory Visit (INDEPENDENT_AMBULATORY_CARE_PROVIDER_SITE_OTHER): Payer: Medicare Other

## 2018-01-17 VITALS — BP 122/84 | HR 51 | Temp 98.4°F | Ht 65.0 in | Wt 160.0 lb

## 2018-01-17 DIAGNOSIS — H409 Unspecified glaucoma: Secondary | ICD-10-CM

## 2018-01-17 DIAGNOSIS — E039 Hypothyroidism, unspecified: Secondary | ICD-10-CM | POA: Diagnosis not present

## 2018-01-17 DIAGNOSIS — I10 Essential (primary) hypertension: Secondary | ICD-10-CM | POA: Diagnosis not present

## 2018-01-17 DIAGNOSIS — E538 Deficiency of other specified B group vitamins: Secondary | ICD-10-CM | POA: Diagnosis not present

## 2018-01-17 DIAGNOSIS — R7301 Impaired fasting glucose: Secondary | ICD-10-CM | POA: Diagnosis not present

## 2018-01-17 LAB — LIPID PANEL
CHOL/HDL RATIO: 4
CHOLESTEROL: 229 mg/dL — AB (ref 0–200)
HDL: 60.8 mg/dL (ref >39.00)
LDL CALC: 156 mg/dL — AB (ref 0–99)
NONHDL: 168.5
Triglycerides: 62 mg/dL (ref 0.0–149.0)
VLDL: 12.4 mg/dL (ref 0.0–40.0)

## 2018-01-17 LAB — CBC
HEMATOCRIT: 41 % (ref 39.0–52.0)
HEMOGLOBIN: 13.7 g/dL (ref 13.0–17.0)
MCHC: 33.3 g/dL (ref 30.0–36.0)
MCV: 95 fl (ref 78.0–100.0)
Platelets: 168 10*3/uL (ref 150.0–400.0)
RBC: 4.32 Mil/uL (ref 4.22–5.81)
RDW: 15 % (ref 11.5–15.5)
WBC: 4.2 10*3/uL (ref 4.0–10.5)

## 2018-01-17 LAB — COMPREHENSIVE METABOLIC PANEL
ALT: 13 U/L (ref 0–53)
AST: 17 U/L (ref 0–37)
Albumin: 3.9 g/dL (ref 3.5–5.2)
Alkaline Phosphatase: 44 U/L (ref 39–117)
BILIRUBIN TOTAL: 0.5 mg/dL (ref 0.2–1.2)
BUN: 12 mg/dL (ref 6–23)
CALCIUM: 9.3 mg/dL (ref 8.4–10.5)
CHLORIDE: 105 meq/L (ref 96–112)
CO2: 27 meq/L (ref 19–32)
Creatinine, Ser: 1.02 mg/dL (ref 0.40–1.50)
GFR: 91.42 mL/min (ref >60.00)
GLUCOSE: 141 mg/dL — AB (ref 70–99)
Potassium: 3.9 mEq/L (ref 3.5–5.1)
Sodium: 140 mEq/L (ref 135–145)
Total Protein: 7.7 g/dL (ref 6.0–8.3)

## 2018-01-17 LAB — TSH: TSH: 2.59 u[IU]/mL (ref 0.35–4.50)

## 2018-01-17 LAB — HEMOGLOBIN A1C: Hgb A1c MFr Bld: 6.5 % (ref 4.6–6.5)

## 2018-01-17 LAB — VITAMIN B12: VITAMIN B 12: 342 pg/mL (ref 211–911)

## 2018-01-17 NOTE — Progress Notes (Signed)
   Subjective:    Patient ID: Jeffrey Grimes, male    DOB: [birth year], 77 y.o.   MRN: [id number]  HPI The patient is a 77 YO man coming in for follow up of b12 deficiency (used to take B12 pills oral but stopped taking those several months ago, denies numbness or weakness in his feet or hands). He is also having glaucoma and seeing eye specialist but wanted to make sure those medications would not interact with any supplements.   Review of Systems  Constitutional: Negative.   HENT: Negative.   Eyes: Negative.   Respiratory: Negative for cough, chest tightness and shortness of breath.   Cardiovascular: Negative for chest pain, palpitations and leg swelling.  Gastrointestinal: Negative for abdominal distention, abdominal pain, constipation, diarrhea, nausea and vomiting.  Musculoskeletal: Negative.   Skin: Negative.   Neurological: Negative.   Psychiatric/Behavioral: Negative.       Objective:   Physical Exam  Constitutional: He is oriented to person, place, and time. He appears well-developed and well-nourished.  HENT:  Head: Normocephalic and atraumatic.  Eyes: EOM are normal.  Neck: Normal range of motion.  Cardiovascular: Normal rate and regular rhythm.  Pulmonary/Chest: Effort normal and breath sounds normal. No respiratory distress. He has no wheezes. He has no rales.  Abdominal: Soft. Bowel sounds are normal. He exhibits no distension. There is no tenderness. There is no rebound.  Musculoskeletal: He exhibits no edema.  Neurological: He is alert and oriented to person, place, and time. Coordination normal.  Skin: Skin is warm and dry.  Psychiatric: He has a normal mood and affect.   Vitals:   01/17/18 1031  BP: 122/84  Pulse: (!) 51  Temp: 98.4 F (36.9 C)  TempSrc: Oral  SpO2: 96%  Weight: 160 lb (72.6 kg)  Height: 5\' 5"  (1.651 m)      Assessment & Plan:

## 2018-01-17 NOTE — Patient Instructions (Signed)
We will check the labs today and call you back about the results.    

## 2018-01-19 NOTE — Assessment & Plan Note (Signed)
BP at goal off meds.  

## 2018-01-19 NOTE — Assessment & Plan Note (Signed)
Sees eye specialist and on meds doing well. No side effects.

## 2018-01-19 NOTE — Assessment & Plan Note (Signed)
Checking TSH, not on meds and no symptoms of recurrence.

## 2018-01-19 NOTE — Assessment & Plan Note (Signed)
Needs HgA1c for monitoring.

## 2018-03-20 ENCOUNTER — Encounter: Payer: Self-pay | Admitting: Podiatry

## 2018-03-20 ENCOUNTER — Ambulatory Visit (INDEPENDENT_AMBULATORY_CARE_PROVIDER_SITE_OTHER): Payer: Medicare Other | Admitting: Podiatry

## 2018-03-20 DIAGNOSIS — M79671 Pain in right foot: Secondary | ICD-10-CM | POA: Diagnosis not present

## 2018-03-20 DIAGNOSIS — M79672 Pain in left foot: Secondary | ICD-10-CM

## 2018-03-20 DIAGNOSIS — B351 Tinea unguium: Secondary | ICD-10-CM | POA: Diagnosis not present

## 2018-03-20 DIAGNOSIS — M21969 Unspecified acquired deformity of unspecified lower leg: Secondary | ICD-10-CM | POA: Diagnosis not present

## 2018-03-20 DIAGNOSIS — Q828 Other specified congenital malformations of skin: Secondary | ICD-10-CM

## 2018-03-20 NOTE — Progress Notes (Signed)
Subjective: 77 y.o. year old male patient presents complaining of painful feet from nails and calluses. He was seen 3 months ago and requests to be seen at least in 2 month. His feet start hurting after one month.  Objective: Dermatologic: Thick yellow deformed nails x 10. Porokeratotic lesions under 4th MPJ left, and under 2nd and 5th MPJ right. Vascular: Pedal pulses are all palpable. Orthopedic: Contracted lesser digits with severe hallux valgus with bunion deformity L>R. Neurologic: All epicritic and tactile sensations grossly intact.  Assessment: Dystrophic mycotic nails x 10. Painful porokeratosis plantar bilateral. Metatarsal deformity bilateral. Painful feet bilateral.  Treatment: All mycotic nails and painful lesions debrided.  May return in 2 month as per request.

## 2018-03-20 NOTE — Patient Instructions (Signed)
Seen for painful calluses and hypertrophic nails. All nails and calluses debrided. Return in 3 months or as needed.  

## 2018-05-22 ENCOUNTER — Ambulatory Visit: Payer: Medicare Other | Admitting: Podiatry

## 2018-06-19 ENCOUNTER — Other Ambulatory Visit: Payer: Self-pay

## 2018-06-19 ENCOUNTER — Encounter (HOSPITAL_COMMUNITY): Payer: Self-pay | Admitting: Emergency Medicine

## 2018-06-19 ENCOUNTER — Ambulatory Visit (HOSPITAL_COMMUNITY)
Admission: EM | Admit: 2018-06-19 | Discharge: 2018-06-19 | Disposition: A | Discharge status: Home / Self Care | Payer: Medicare Other | Attending: Family Medicine | Admitting: Family Medicine

## 2018-06-19 DIAGNOSIS — M25552 Pain in left hip: Secondary | ICD-10-CM | POA: Insufficient documentation

## 2018-06-19 MED ORDER — DICLOFENAC SODIUM 1 % TD GEL: 4.0000 g | Freq: Four times a day (QID) | TRANSDERMAL | 1 refills | Status: DC

## 2018-06-19 NOTE — ED Provider Notes (Signed)
[facility]    CSN: [id number] Arrival date & time: 06/19/18  7035     History   Chief Complaint Chief Complaint  Patient presents with  . Hip Pain    HPI Jeffrey Grimes is a 77 y.o. male.   77 year old male presents with left hip pain for the past 2 to 3 days. When getting out of bed a few days ago, experienced pain in the left hip. No distinct injury. No fall. After moving around, the pain improves but gets worse after sitting for a short period or laying down. Pain does not radiate and denies any fever, numbness or back pain. Only chronic health issue is glaucoma and uses topical eye drops daily.   The history is provided by the patient.    Past Medical History:  Diagnosis Date  . Anxiety   . Colon polyps   . Constipation   . Depression   . Glaucoma   . Heart murmur   . Hypertension   . Thyroid disease     Patient Active Problem List   Diagnosis Date Noted  . PTSD (post-traumatic stress disorder) 08/07/2015  . Insomnia 08/07/2015  . IFG (impaired fasting glucose) 02/05/2014  . Depression 02/05/2014  . Hypothyroidism 08/12/2013  . Glaucoma 08/12/2013  . Essential hypertension, benign 08/12/2013    Past Surgical History:  Procedure Laterality Date  . COLONOSCOPY     done in [place] 2010       Home Medications    Prior to Admission medications   Medication Sig Start Date End Date Taking? Authorizing Provider  bimatoprost (LUMIGAN) 0.01 % SOLN Place 1 drop into both eyes at bedtime.    Yes [provider]  COMBIGAN 0.2-0.5 % ophthalmic solution Place 1 drop into both eyes daily.  07/16/14  Yes [provider]  timolol (BETIMOL) 0.25 % ophthalmic solution Place 1 drop into both eyes daily.   Yes [provider]  diclofenac sodium (VOLTAREN) 1 % GEL Apply 4 g topically 4 (four) times daily. 06/19/18   Katy Apo, NP    Family History Family History  Problem Relation Age of Onset  . Diabetes Mother   .  Hypertension Mother   . Heart disease Mother   . Hypertension Father   . Heart disease Father   . Thyroid disease Sister   . Hypertension Brother   . Diabetes Brother   . Prostate cancer Brother   . Heart disease Brother     Social History Social History   Tobacco Use  . Smoking status: Former Research scientist (life sciences)  . Smokeless tobacco: Never Used  Substance Use Topics  . Alcohol use: Yes    Alcohol/week: 0.0 standard drinks    Comment: socially   . Drug use: No     Allergies   Patient has no known allergies.   Review of Systems Review of Systems  Constitutional: Negative for activity change, appetite change, chills, fatigue and fever.  Respiratory: Negative for cough, chest tightness, shortness of breath and wheezing.   Cardiovascular: Negative for chest pain and leg swelling.  Gastrointestinal: Negative for nausea and vomiting.  Genitourinary: Negative for decreased urine volume, difficulty urinating and flank pain.  Musculoskeletal: Positive for arthralgias. Negative for back pain, joint swelling and myalgias.  Skin: Negative for color change, rash and wound.  Allergic/Immunologic: Negative for immunocompromised state.  Neurological: Negative for dizziness, tremors, seizures, syncope, weakness, light-headedness, numbness and headaches.  Hematological: Negative for adenopathy. Does not bruise/bleed easily.  Physical Exam Triage Vital Signs ED Triage Vitals  Enc Vitals Group     BP 06/19/18 0953 (!) 155/90     Pulse Rate 06/19/18 0953 66     Resp 06/19/18 0953 16     Temp 06/19/18 0953 98.6 F (37 C)     Temp Source 06/19/18 0953 Oral     SpO2 06/19/18 0953 98 %     Weight --      Height --      Head Circumference --      Peak Flow --      Pain Score 06/19/18 0951 6     Pain Loc --      Pain Edu? --      Excl. in [place]? --    No data found.  Updated Vital Signs BP (!) 155/90   Pulse 66   Temp 98.6 F (37 C) (Oral)   Resp 16   SpO2 98%   Visual Acuity Right  Eye Distance:   Left Eye Distance:   Bilateral Distance:    Right Eye Near:   Left Eye Near:    Bilateral Near:     Physical Exam Vitals signs and nursing note reviewed.  Constitutional:      General: He is not in acute distress.    Appearance: Normal appearance. He is well-developed, well-groomed and normal weight. He is not ill-appearing.     Comments: Patient sitting comfortably on exam table in no acute distress.   HENT:     Head: Normocephalic and atraumatic.  Eyes:     Extraocular Movements: Extraocular movements intact.     Conjunctiva/sclera: Conjunctivae normal.  Neck:     Musculoskeletal: Normal range of motion.  Cardiovascular:     Rate and Rhythm: Normal rate.  Pulmonary:     Effort: Pulmonary effort is normal.  Musculoskeletal: Normal range of motion.        General: No swelling, tenderness, deformity or signs of injury.     Left hip: He exhibits normal range of motion, normal strength, no tenderness, no swelling and no deformity.     Right lower leg: No edema.     Left lower leg: No edema.       Legs:     Comments: Has full range of motion of left hip and pain near iliac crest only with full extension. No distinct tenderness. No redness, swelling or bruising present. No numbness or neuro deficits noted. Good distal pulses.   Skin:    General: Skin is warm and dry.     Findings: No bruising, erythema or rash.  Neurological:     General: No focal deficit present.     Mental Status: He is alert and oriented to person, place, and time.     Sensory: Sensation is intact. No sensory deficit.     Motor: Motor function is intact.     Gait: Gait is intact.     Deep Tendon Reflexes: Reflexes are normal and symmetric.  Psychiatric:        Attention and Perception: Attention normal.        Mood and Affect: Mood and affect normal.        Speech: Speech normal.        Behavior: Behavior is cooperative.        Thought Content: Thought content normal.        Cognition  and Memory: Cognition and memory normal.        Judgment: Judgment normal.  UC Treatments / Results  Labs (all labs ordered are listed, but only abnormal results are displayed) Labs Reviewed - No data to display  EKG None  Radiology No results found.  Procedures Procedures (including critical care time)  Medications Ordered in UC Medications - No data to display  Initial Impression / Assessment and Plan / UC Course  I have reviewed the triage vital signs and the nursing notes.  Pertinent labs & imaging results that were available during my care of the patient were reviewed by me and considered in my medical decision making (see chart for details).    Discussed with patient that he may have mild arthritis or tendonitis of his hip. Discussed possibility of imaging- will trial anti-inflammatory medications first but he will return for x-ray if not improving. Recommend Voltaren gel- apply up to 4 times a day to affected area. May apply heat to area for comfort. Follow-up here or with his PCP in 4 to 5 days if not improving.   Final Clinical Impressions(s) / UC Diagnoses   Final diagnoses:  Left hip pain     Discharge Instructions     Recommend try Voltaren gel- apply up to 4 times a day as needed to affected areas. May use warm compresses or heating pad to help with pain. Follow-up here or with your PCP in 4 to 5 days if not improving.     ED Prescriptions    Medication Sig Dispense Auth. Provider   diclofenac sodium (VOLTAREN) 1 % GEL Apply 4 g topically 4 (four) times daily. 100 g Katy Apo, NP     Controlled Substance Prescriptions [place] Controlled Substance Registry consulted? Not Applicable   Katy Apo, NP 06/20/18 [phone]

## 2018-06-19 NOTE — ED Triage Notes (Signed)
PT reports left hip pain for a few days. No injury

## 2018-06-19 NOTE — Discharge Instructions (Addendum)
Recommend try Voltaren gel- apply up to 4 times a day as needed to affected areas. May use warm compresses or heating pad to help with pain. Follow-up here or with your PCP in 4 to 5 days if not improving.

## 2018-07-01 ENCOUNTER — Ambulatory Visit (HOSPITAL_COMMUNITY)
Admission: EM | Admit: 2018-07-01 | Discharge: 2018-07-01 | Disposition: A | Discharge status: Home / Self Care | Payer: Medicare Other | Attending: Family Medicine | Admitting: Family Medicine

## 2018-07-01 ENCOUNTER — Encounter (HOSPITAL_COMMUNITY): Payer: Self-pay

## 2018-07-01 DIAGNOSIS — M1612 Unilateral primary osteoarthritis, left hip: Secondary | ICD-10-CM | POA: Diagnosis not present

## 2018-07-01 MED ORDER — METHYLPREDNISOLONE ACETATE 80 MG/ML IJ SUSP
80.0000 mg | Freq: Once | INTRAMUSCULAR | Status: AC
Start: 2018-07-01 — End: 2018-07-01
  Administered 2018-07-01: 80 mg via INTRAMUSCULAR

## 2018-07-01 MED ORDER — METHYLPREDNISOLONE ACETATE 80 MG/ML IJ SUSP
INTRAMUSCULAR | Status: AC
  Filled 2018-07-01: qty 1

## 2018-07-01 NOTE — ED Provider Notes (Signed)
[place]    CSN: [id number] Arrival date & time: 07/01/18  1145     History   Chief Complaint Chief Complaint  Patient presents with  . Hip Pain    HPI Jeffrey Grimes is a 77 y.o. male.   77 year old gentleman with left hip pain for a month.  He has had no injury.  He notes that the pain is worse when he is been inactive for a while.  It is especially bad in the morning.  He was seen 2 weeks ago he here and given diclofenac gel.  It has not made any difference.  He is able to bear weight without pain.  He has no other joint soreness.     Past Medical History:  Diagnosis Date  . Anxiety   . Colon polyps   . Constipation   . Depression   . Glaucoma   . Heart murmur   . Hypertension   . Thyroid disease     Patient Active Problem List   Diagnosis Date Noted  . PTSD (post-traumatic stress disorder) 08/07/2015  . Insomnia 08/07/2015  . IFG (impaired fasting glucose) 02/05/2014  . Depression 02/05/2014  . Hypothyroidism 08/12/2013  . Glaucoma 08/12/2013  . Essential hypertension, benign 08/12/2013    Past Surgical History:  Procedure Laterality Date  . COLONOSCOPY     done in [place] 2010       Home Medications    Prior to Admission medications   Medication Sig Start Date End Date Taking? Authorizing Provider  bimatoprost (LUMIGAN) 0.01 % SOLN Place 1 drop into both eyes at bedtime.     [provider]  COMBIGAN 0.2-0.5 % ophthalmic solution Place 1 drop into both eyes daily.  07/16/14   [provider]  diclofenac sodium (VOLTAREN) 1 % GEL Apply 4 g topically 4 (four) times daily. 06/19/18   Katy Apo, NP  timolol (BETIMOL) 0.25 % ophthalmic solution Place 1 drop into both eyes daily.    [provider]    Family History Family History  Problem Relation Age of Onset  . Diabetes Mother   . Hypertension Mother   . Heart disease Mother   . Hypertension Father   . Heart disease Father   . Thyroid  disease Sister   . Hypertension Brother   . Diabetes Brother   . Prostate cancer Brother   . Heart disease Brother     Social History Social History   Tobacco Use  . Smoking status: Former Research scientist (life sciences)  . Smokeless tobacco: Never Used  Substance Use Topics  . Alcohol use: Yes    Alcohol/week: 0.0 standard drinks    Comment: socially   . Drug use: No     Allergies   Patient has no known allergies.   Review of Systems Review of Systems   Physical Exam Triage Vital Signs ED Triage Vitals  Enc Vitals Group     BP 07/01/18 1320 (!) 157/83     Pulse Rate 07/01/18 1320 74     Resp 07/01/18 1320 18     Temp 07/01/18 1320 98.3 F (36.8 C)     Temp Source 07/01/18 1320 Oral     SpO2 07/01/18 1320 99 %     Weight --      Height --      Head Circumference --      Peak Flow --      Pain Score 07/01/18 1323 6     Pain Loc --  Pain Edu? --      Excl. in [place]? --    No data found.  Updated Vital Signs BP (!) 157/83 (BP Location: Right Arm)   Pulse 74   Temp 98.3 F (36.8 C) (Oral)   Resp 18   SpO2 99%   Physical Exam Vitals signs and nursing note reviewed.  Constitutional:      Appearance: Normal appearance.  HENT:     Head: Normocephalic.     Mouth/Throat:     Mouth: Mucous membranes are moist.  Eyes:     Conjunctiva/sclera: Conjunctivae normal.  Neck:     Musculoskeletal: Normal range of motion and neck supple.  Pulmonary:     Effort: Pulmonary effort is normal.  Musculoskeletal: Normal range of motion.     Comments: I palpated the entire hip and found no localized tenderness  I move the hip in all directions without any pain.  Skin:    General: Skin is warm and dry.  Neurological:     General: No focal deficit present.     Mental Status: He is alert.  Psychiatric:        Mood and Affect: Mood normal.        Behavior: Behavior normal.      UC Treatments / Results  Labs (all labs ordered are listed, but only abnormal results are displayed) Labs  Reviewed - No data to display  EKG None  Radiology No results found.  Procedures Procedures (including critical care time)  Medications Ordered in UC Medications  methylPREDNISolone acetate (DEPO-MEDROL) injection 80 mg (has no administration in time range)    Initial Impression / Assessment and Plan / UC Course  I have reviewed the triage vital signs and the nursing notes.  Pertinent labs & imaging results that were available during my care of the patient were reviewed by me and considered in my medical decision making (see chart for details).    Final Clinical Impressions(s) / UC Diagnoses   Final diagnoses:  Primary osteoarthritis of left hip     Discharge Instructions     Return here or see your primary care doctor if your hip pain is not getting better over the next several days.    ED Prescriptions    None     Controlled Substance Prescriptions [place] Controlled Substance Registry consulted? Not Applicable   Robyn Haber, MD 07/01/18 1353

## 2018-07-01 NOTE — Discharge Instructions (Addendum)
Return here or see your primary care doctor if your hip pain is not getting better over the next several days.

## 2018-07-01 NOTE — ED Triage Notes (Signed)
Pt presents with ongoing left hip pain.

## 2018-10-23 ENCOUNTER — Ambulatory Visit (INDEPENDENT_AMBULATORY_CARE_PROVIDER_SITE_OTHER): Payer: Medicare Other | Admitting: Internal Medicine

## 2018-10-23 ENCOUNTER — Encounter: Payer: Self-pay | Admitting: Internal Medicine

## 2018-10-23 DIAGNOSIS — M79602 Pain in left arm: Secondary | ICD-10-CM | POA: Insufficient documentation

## 2018-10-23 NOTE — Assessment & Plan Note (Signed)
Suspect some superficial phlebitis due to straining and yard work recently. No swelling to suggest DVT or numbness or tingling. Pain is minimal and can use heating pad on the area. Advised to watch for skin rash, swelling in arm, numbness in arm, weakness left arm. Call back for any concerns.

## 2018-10-23 NOTE — Progress Notes (Signed)
Virtual Visit via Video Note  I connected with Jeffrey Grimes on 10/23/18 at 10:20 AM EDT by a video enabled telemedicine application and verified that I am speaking with the correct person using two identifiers.   I discussed the limitations of evaluation and management by telemedicine and the availability of in person appointments. The patient expressed understanding and agreed to proceed.  History of Present Illness: The patient is a 78 y.o. man with visit for prominent vein on the left arm underneath by the armpit. Started about 2 weeks ago or so that he noticed it. It is slightly hard to the touch and mildly painful to touch. Does not hurt if not mashing on it. Denies fevers or chills. Denies cough or SOB. Denies swelling or numbness or weakness in the left arm. Denies chest pains. Denies history of blood clot. Has not changed much since it started. Overall it is stable. Has tried nothing for it  Observations/Objective: Appearance: normal, breathing appears normal, casual grooming, abdomen does not appear distended, throat normal, left arm anterior decubitus with a prominent vein on the surface which is mildly tender to patient with palpation, mental status is A and O times 3  Assessment and Plan: See problem oriented charting  Follow Up Instructions: no imaging needed, could be venous cord or superficial phlebitis, warm compress and tylenol if needed for pain  I discussed the assessment and treatment plan with the patient. The patient was provided an opportunity to ask questions and all were answered. The patient agreed with the plan and demonstrated an understanding of the instructions.   The patient was advised to call back or seek an in-person evaluation if the symptoms worsen or if the condition fails to improve as anticipated.  Hoyt Koch, MD

## 2019-01-31 ENCOUNTER — Other Ambulatory Visit: Payer: Self-pay

## 2019-02-01 ENCOUNTER — Other Ambulatory Visit (INDEPENDENT_AMBULATORY_CARE_PROVIDER_SITE_OTHER): Payer: Medicare Other

## 2019-02-01 ENCOUNTER — Ambulatory Visit (INDEPENDENT_AMBULATORY_CARE_PROVIDER_SITE_OTHER): Payer: Medicare Other | Admitting: Internal Medicine

## 2019-02-01 ENCOUNTER — Encounter: Payer: Self-pay | Admitting: Internal Medicine

## 2019-02-01 ENCOUNTER — Other Ambulatory Visit: Payer: Self-pay

## 2019-02-01 VITALS — BP 144/84 | HR 78 | Temp 98.9°F | Ht 65.0 in | Wt 156.0 lb

## 2019-02-01 DIAGNOSIS — I10 Essential (primary) hypertension: Secondary | ICD-10-CM | POA: Diagnosis not present

## 2019-02-01 DIAGNOSIS — R7301 Impaired fasting glucose: Secondary | ICD-10-CM | POA: Diagnosis not present

## 2019-02-01 DIAGNOSIS — E039 Hypothyroidism, unspecified: Secondary | ICD-10-CM

## 2019-02-01 DIAGNOSIS — E538 Deficiency of other specified B group vitamins: Secondary | ICD-10-CM

## 2019-02-01 DIAGNOSIS — R413 Other amnesia: Secondary | ICD-10-CM

## 2019-02-01 LAB — COMPREHENSIVE METABOLIC PANEL
ALT: 12 U/L (ref 0–53)
AST: 18 U/L (ref 0–37)
Albumin: 3.9 g/dL (ref 3.5–5.2)
Alkaline Phosphatase: 44 U/L (ref 39–117)
BUN: 13 mg/dL (ref 6–23)
CO2: 25 mEq/L (ref 19–32)
Calcium: 9.6 mg/dL (ref 8.4–10.5)
Chloride: 103 mEq/L (ref 96–112)
Creatinine, Ser: 1.08 mg/dL (ref 0.40–1.50)
GFR: 80.3 mL/min (ref >60.00)
Glucose, Bld: 150 mg/dL — ABNORMAL HIGH (ref 70–99)
Potassium: 4.1 mEq/L (ref 3.5–5.1)
Sodium: 137 mEq/L (ref 135–145)
Total Bilirubin: 0.6 mg/dL (ref 0.2–1.2)
Total Protein: 8 g/dL (ref 6.0–8.3)

## 2019-02-01 LAB — LIPID PANEL
Cholesterol: 182 mg/dL (ref 0–200)
HDL: 56 mg/dL (ref >39.00)
LDL Cholesterol: 116 mg/dL — ABNORMAL HIGH (ref 0–99)
NonHDL: 126.19
Total CHOL/HDL Ratio: 3
Triglycerides: 53 mg/dL (ref 0.0–149.0)
VLDL: 10.6 mg/dL (ref 0.0–40.0)

## 2019-02-01 LAB — CBC
HCT: 38.9 % — ABNORMAL LOW (ref 39.0–52.0)
Hemoglobin: 13 g/dL (ref 13.0–17.0)
MCHC: 33.3 g/dL (ref 30.0–36.0)
MCV: 95.7 fl (ref 78.0–100.0)
Platelets: 172 10*3/uL (ref 150.0–400.0)
RBC: 4.06 Mil/uL — ABNORMAL LOW (ref 4.22–5.81)
RDW: 14.8 % (ref 11.5–15.5)
WBC: 5.4 10*3/uL (ref 4.0–10.5)

## 2019-02-01 LAB — T4, FREE: Free T4: 0.8 ng/dL (ref 0.60–1.60)

## 2019-02-01 LAB — TSH: TSH: 3.03 u[IU]/mL (ref 0.35–4.50)

## 2019-02-01 LAB — VITAMIN B12: Vitamin B-12: 311 pg/mL (ref 211–911)

## 2019-02-01 LAB — HEMOGLOBIN A1C: Hgb A1c MFr Bld: 6.5 % (ref 4.6–6.5)

## 2019-02-01 NOTE — Progress Notes (Signed)
   Subjective:   Patient ID: Jeffrey Grimes, male    DOB: 09-Jan-1941, 78 y.o.   MRN: [id number]  HPI The patient is a 78 YO man coming in for several concerns including memory loss (poor short term memory, wants to try prevagen over the counter, wanted to make sure it is safe, feels like he has been this way for some time, hard for him to say it is worse lately, denies leaving stove on or losing keys, not driving much due to pandemic but denies accident, tickets, or getting lost) and foot problems (wants a new podiatrist, has callusing on his feet due to pressure points, he is taking care of this at home and does not want to go to podiatrist, had one he liked but was told they are not there anymore or something, denies foot exam except when the callusing gets large), and impaired fasting sugars (with the pandemic he is worried about increase, denies numbness or tingling, denies excessive thirst or urination change).   Review of Systems  Constitutional: Negative.   HENT: Negative.   Eyes: Negative.   Respiratory: Negative for cough, chest tightness and shortness of breath.   Cardiovascular: Negative for chest pain, palpitations and leg swelling.  Gastrointestinal: Negative for abdominal distention, abdominal pain, constipation, diarrhea, nausea and vomiting.  Musculoskeletal: Negative.   Skin: Negative.        Callusing on feet  Neurological: Negative.        Memory changes  Psychiatric/Behavioral: Negative.     Objective:  Physical Exam Constitutional:      Appearance: He is well-developed. He is obese.  HENT:     Head: Normocephalic and atraumatic.  Neck:     Musculoskeletal: Normal range of motion.  Cardiovascular:     Rate and Rhythm: Normal rate and regular rhythm.  Pulmonary:     Effort: Pulmonary effort is normal. No respiratory distress.     Breath sounds: Normal breath sounds. No wheezing or rales.  Abdominal:     General: Bowel sounds are normal. There is no distension.      Palpations: Abdomen is soft.     Tenderness: There is no abdominal tenderness. There is no rebound.  Skin:    General: Skin is warm and dry.     Comments: Feet examined and some callusing without skin breakdown  Neurological:     Mental Status: He is alert and oriented to person, place, and time.     Coordination: Coordination normal.     Vitals:   02/01/19 1058  BP: (!) 144/84  Pulse: 78  Temp: 98.9 F (37.2 C)  TempSrc: Oral  SpO2: 98%  Weight: 156 lb (70.8 kg)  Height: 5\' 5"  (1.651 m)    Assessment & Plan:

## 2019-02-01 NOTE — Assessment & Plan Note (Signed)
Not treated with meds. Checking TSH and free T4. Adjust as needed.

## 2019-02-01 NOTE — Assessment & Plan Note (Signed)
Needs HgA1c to follow up today. Adjust as needed.

## 2019-02-01 NOTE — Assessment & Plan Note (Signed)
BP at goal and checking CMP and adjust as needed.

## 2019-02-01 NOTE — Assessment & Plan Note (Signed)
He does not want further evaluation today. Checking B12, thyroid, CBC, CMP. He will try prevagen and if no improvement he will call back or return.

## 2019-02-01 NOTE — Patient Instructions (Signed)
We are checking the blood work today.  You can try prevagen over the counter.

## 2019-02-04 ENCOUNTER — Other Ambulatory Visit: Payer: Self-pay | Admitting: Internal Medicine

## 2019-02-04 MED ORDER — SIMVASTATIN 20 MG PO TABS: 20.0000 mg | ORAL_TABLET | Freq: Every day | ORAL | 3 refills | Status: DC

## 2019-05-09 ENCOUNTER — Ambulatory Visit: Payer: Medicare Other

## 2019-05-15 NOTE — Progress Notes (Addendum)
Subjective:   Jeffrey Grimes is a 78 y.o. male who presents for Medicare Annual/Subsequent preventive examination. I connected with patient by a telephone and verified that I am speaking with the correct person using two identifiers. Patient stated full name and DOB. Patient gave permission to continue with telephonic visit. Patient's location was at home and Nurse's location was at [place] office. Participants during this visit included patient and nurse.  Review of Systems:   Cardiac Risk Factors include: advanced age (>71men, >8 women);male gender Sleep patterns: feels rested on waking, gets up 2 times nightly to void and sleeps 7-8 hours nightly.    Home Safety/Smoke Alarms: Feels safe in home. Smoke alarms in place.  Living environment; residence and Firearm Safety: 1-story house/ trailer. Lives with alone, no needs for DME, good support system Seat Belt Safety/Bike Helmet: Wears seat belt.     Objective:    Vitals: There were no vitals taken for this visit.  There is no height or weight on file to calculate BMI.  Advanced Directives 12/19/2017 07/29/2016 05/25/2015 06/18/2014  Does Patient Have a Medical Advance Directive? Yes Yes No No  Type of Paramedic of [place];Living will Living will - -  Copy of [place] in Chart? No - copy requested No - copy requested - -  Would patient like information on creating a medical advance directive? - - - No - patient declined information    Tobacco Social History   Tobacco Use  Smoking Status Former Smoker  Smokeless Tobacco Never Used     Counseling given: Not Answered  Past Medical History:  Diagnosis Date  . Anxiety   . Colon polyps   . Constipation   . Depression   . Glaucoma   . Heart murmur   . Hypertension   . Thyroid disease    Past Surgical History:  Procedure Laterality Date  . COLONOSCOPY     done in [place] 2010   Family History  Problem Relation Age of Onset   . Diabetes Mother   . Hypertension Mother   . Heart disease Mother   . Hypertension Father   . Heart disease Father   . Thyroid disease Sister   . Hypertension Brother   . Diabetes Brother   . Prostate cancer Brother   . Heart disease Brother    Social History   Socioeconomic History  . Marital status: Married    Spouse name: Not on file  . Number of children: 0  . Years of education: Not on file  . Highest education level: Not on file  Occupational History  . Occupation: Retired   Scientific laboratory technician  . Financial resource strain: Not hard at all  . Food insecurity    Worry: Never true    Inability: Never true  . Transportation needs    Medical: No    Non-medical: No  Tobacco Use  . Smoking status: Former Research scientist (life sciences)  . Smokeless tobacco: Never Used  Substance and Sexual Activity  . Alcohol use: Yes    Alcohol/week: 0.0 standard drinks    Comment: socially   . Drug use: No  . Sexual activity: Never  Lifestyle  . Physical activity    Days per week: 3 days    Minutes per session: 50 min  . Stress: Not at all  Relationships  . Social connections    Talks on phone: More than three times a week    Gets together: More than three times  a week    Attends religious service: More than 4 times per year    Active member of club or organization: Yes    Attends meetings of clubs or organizations: More than 4 times per year    Relationship status: Married  Other Topics Concern  . Not on file  Social History Narrative  . Not on file    Outpatient Encounter Medications as of 05/16/2019  Medication Sig  . acetaminophen (TYLENOL) 500 MG tablet Take 500 mg by mouth every 6 (six) hours as needed.  Marland Kitchen aspirin EC 81 MG tablet Take 81 mg by mouth daily.  . bimatoprost (LUMIGAN) 0.01 % SOLN Place 1 drop into both eyes at bedtime.   . COMBIGAN 0.2-0.5 % ophthalmic solution Place 1 drop into both eyes daily.   . diclofenac sodium (VOLTAREN) 1 % GEL Apply 4 g topically 4 (four) times daily.   . simvastatin (ZOCOR) 20 MG tablet Take 1 tablet (20 mg total) by mouth at bedtime.  . timolol (BETIMOL) 0.25 % ophthalmic solution Place 1 drop into both eyes daily.   No facility-administered encounter medications on file as of 05/16/2019.     Activities of Daily Living In your present state of health, do you have any difficulty performing the following activities: 05/16/2019  Hearing? N  Vision? N  Difficulty concentrating or making decisions? N  Walking or climbing stairs? N  Dressing or bathing? N  Doing errands, shopping? N  Preparing Food and eating ? N  Using the Toilet? N  In the past six months, have you accidently leaked urine? N  Do you have problems with loss of bowel control? N  Managing your Medications? N  Managing your Finances? N  Housekeeping or managing your Housekeeping? N  Some recent data might be hidden    Patient Care Team: Hoyt Koch, MD as PCP - General (Internal Medicine)   Assessment:   This is a routine wellness examination for [place]. Physical assessment deferred to PCP.  Exercise Activities and Dietary recommendations Current Exercise Habits: Home exercise routine, Time (Minutes): 35, Frequency (Times/Week): 5, Weekly Exercise (Minutes/Week): 175, Intensity: Mild, Exercise limited by: None identified  Diet (meal preparation, eat out, water intake, caffeinated beverages, dairy products, fruits and vegetables): in general, a "healthy" diet  , well balanced   Reviewed heart healthy and diabetic diet. Encouraged patient to increase daily water and healthy fluid intake.  Goals    . Improve quality of sleep    . Patient Stated     Maintain my current healthy status by continuing to eat healthy and stay physically and socially active.       Fall Risk Fall Risk  05/16/2019 02/01/2019 12/19/2017 07/29/2016 08/05/2015  Falls in the past year? 0 1 No No No  Number falls in past yr: 0 1 - - -  Injury with Fall? 0 1 - - -  Comment - scraped  knees up no ED or urgent care visit - - -  Risk for fall due to : Impaired balance/gait - Impaired mobility - -  Follow up Falls prevention discussed - - - -   Is the patient's home free of loose throw rugs in walkways, pet beds, electrical cords, etc?   yes      Grab bars in the bathroom? yes      Handrails on the stairs?   yes      Adequate lighting?   yes  Depression Screen PHQ 2/9 Scores 05/16/2019 12/19/2017 07/29/2016  08/05/2015  PHQ - 2 Score 1 1 2 2   PHQ- 9 Score 1 6 12 8     Cognitive Function MMSE - Mini Mental State Exam 05/16/2019 12/19/2017 07/29/2016  Not completed: Refused - -  Orientation to time - 5 5  Orientation to Place - 5 5  Registration - 3 3  Attention/ Calculation - 4 2  Recall - 1 1  Language- name 2 objects - 2 2  Language- repeat - 1 1  Language- follow 3 step command - 3 3  Language- read & follow direction - 1 1  Write a sentence - 1 1  Copy design - 1 1  Total score - 27 25       Ad8 score reviewed for issues:  Issues making decisions: no  Less interest in hobbies / activities: no  Repeats questions, stories (family complaining): no  Trouble using ordinary gadgets (microwave, computer, phone):no  Forgets the month or year: no  Mismanaging finances: no  Remembering appts: yes  Daily problems with thinking and/or memory: no Ad8 score is= 0  States he is having forgetful moments and he states that he does not do well with the MMS exam.  Immunization History  Administered Date(s) Administered  . Influenza,inj,Quad PF,6+ Mos 04/13/2015, 07/29/2016  . Tdap 08/04/2014    Screening Tests Health Maintenance  Topic Date Due  . INFLUENZA VACCINE  02/02/2019  . PNA vac Low Risk Adult (1 of 2 - PCV13) 02/01/2020 (Originally 06/13/2007)  . TETANUS/TDAP  08/04/2024      Plan:    Reviewed health maintenance screenings with patient today and relevant education, vaccines, and/or referrals were provided.   Continue to eat heart healthy diet  (full of fruits, vegetables, whole grains, lean protein, water--limit salt, fat, and sugar intake) and increase physical activity as tolerated.  Continue doing brain stimulating activities (puzzles, reading, adult coloring books, staying active) to keep memory sharp.   I have personally reviewed and noted the following in the patient's chart:   . Medical and social history . Use of alcohol, tobacco or illicit drugs  . Current medications and supplements . Functional ability and status . Nutritional status . Physical activity . Advanced directives . List of other physicians . Screenings to include cognitive, depression, and falls . Referrals and appointments  In addition, I have reviewed and discussed with patient certain preventive protocols, quality metrics, and best practice recommendations. A written personalized care plan for preventive services as well as general preventive health recommendations were provided to patient.     Michiel Cowboy, RN  05/16/2019  Medical screening examination/treatment/procedure(s) were performed by non-physician practitioner and as supervising physician I was immediately available for consultation/collaboration. I agree with above. Cathlean Cower, MD

## 2019-05-16 ENCOUNTER — Ambulatory Visit (INDEPENDENT_AMBULATORY_CARE_PROVIDER_SITE_OTHER): Payer: Medicare Other | Admitting: *Deleted

## 2019-05-16 DIAGNOSIS — Z0000 Encounter for general adult medical examination without abnormal findings: Secondary | ICD-10-CM | POA: Diagnosis not present

## 2019-11-06 ENCOUNTER — Telehealth: Payer: Self-pay | Admitting: Internal Medicine

## 2019-11-06 NOTE — Progress Notes (Signed)
  Chronic Care Management   Note  11/06/2019 Name: Tajiddin Schielke MRN: [id number] DOB: 11/17/1940  Juane Mossey is a 79 y.o. year old male who is a primary care patient of Sharlet Salina Real Cons, MD. I reached out to [name] by phone today in response to a referral sent by Mr. Kerry Dory Salway's PCP, Hoyt Koch, MD.   Mr. Jakubik was given information about Chronic Care Management services today including:  1. CCM service includes personalized support from designated clinical staff supervised by his physician, including individualized plan of care and coordination with other care providers 2. 24/7 contact phone numbers for assistance for urgent and routine care needs. 3. Service will only be billed when office clinical staff spend 20 minutes or more in a month to coordinate care. 4. Only one practitioner may furnish and bill the service in a calendar month. 5. The patient may stop CCM services at any time (effective at the end of the month) by phone call to the office staff.   Patient agreed to services and verbal consent obtained.    This note is not being shared with the patient for the following reason: To respect privacy (The patient or proxy has requested that the information not be shared).  Follow up plan:   Raynicia Dukes [employer] Scheduler

## 2019-12-26 ENCOUNTER — Telehealth: Payer: Medicare Other

## 2020-02-17 ENCOUNTER — Telehealth: Payer: Medicare Other

## 2020-02-17 NOTE — Chronic Care Management (AMB) (Deleted)
Chronic Care Management Pharmacy  Name: Jeffrey Grimes  MRN: [id number] DOB: [birth year]   Chief Complaint/ HPI  Jeffrey Grimes,  79 y.o. , male presents for their Initial CCM visit with the clinical pharmacist via telephone due to COVID-19 Pandemic.  PCP : Hoyt Koch, MD Patient Care Team: Hoyt Koch, MD as PCP - General (Internal Medicine) Charlton Haws, [facility] as Pharmacist (Pharmacist)  Their chronic conditions include: Hypertension, Hypothyroidism, Depression and PTSD, Glaucoma  Office Visits: ***  Consult Visit: ***  No Known Allergies  Medications: Outpatient Encounter Medications as of 02/17/2020  Medication Sig Note   acetaminophen (TYLENOL) 500 MG tablet Take 500 mg by mouth every 6 (six) hours as needed.    aspirin EC 81 MG tablet Take 81 mg by mouth daily.    bimatoprost (LUMIGAN) 0.01 % SOLN Place 1 drop into both eyes at bedtime.     COMBIGAN 0.2-0.5 % ophthalmic solution Place 1 drop into both eyes daily.  07/22/2014: Received from: External Pharmacy   diclofenac sodium (VOLTAREN) 1 % GEL Apply 4 g topically 4 (four) times daily.    simvastatin (ZOCOR) 20 MG tablet Take 1 tablet (20 mg total) by mouth at bedtime.    timolol (BETIMOL) 0.25 % ophthalmic solution Place 1 drop into both eyes daily.    No facility-administered encounter medications on file as of 02/17/2020.     Current Diagnosis/Assessment:    Goals Addressed   None     Hypertension   BP goal is:  <140/90  Office blood pressures are  BP Readings from Last 3 Encounters:  02/01/19 (!) 144/84  07/01/18 (!) 157/83  06/19/18 (!) 155/90   Patient checks BP at home {CHL HP BP Monitoring Frequency:[phone]} Patient home BP readings are ranging: ***  Patient has failed these meds in the past: amlodipine, lisinopril, HCTZ,  Patient is currently {CHL Controlled/[phone]} on the following medications:   No medications  We discussed  {CHL HP Upstream Pharmacy discussion:[phone]}  Plan  Continue {CHL HP Upstream Pharmacy Plans:7082002738}   Hyperlipidemia   LDL goal < 100  Lipid Panel     Component Value Date/Time   CHOL 182 02/01/2019 1143   TRIG 53.0 02/01/2019 1143   HDL 56.00 02/01/2019 1143   LDLCALC 116 (H) 02/01/2019 1143    Hepatic Function Latest Ref Rng & Units 02/01/2019 01/17/2018 02/03/2016  Total Protein 6.0 - 8.3 g/dL 8.0 7.7 7.8  Albumin 3.5 - 5.2 g/dL 3.9 3.9 4.0  AST 0 - 37 U/L 18 17 23   ALT 0 - 53 U/L 12 13 16   Alk Phosphatase 39 - 117 U/L 44 44 48  Total Bilirubin 0.2 - 1.2 mg/dL 0.6 0.5 0.5    The ASCVD Risk score Mikey Bussing DC Jr., et al., 2013) failed to calculate for the following reasons:   The systolic blood pressure is missing   Patient has failed these meds in past: n/a Patient is currently {CHL Controlled/[phone]} on the following medications:   Simvastatin 20 mg daily  Aspirin 81 mg daily  We discussed:  {CHL HP Upstream Pharmacy discussion:[phone]}  Plan  Continue {CHL HP Upstream Pharmacy Plans:7082002738}   Hypothyroidism   Lab Results  Component Value Date/Time   TSH 3.03 02/01/2019 11:43 AM   TSH 2.59 01/17/2018 11:09 AM   FREET4 0.80 02/01/2019 11:43 AM   FREET4 0.53 (L) 08/01/2016 03:05 PM   Patient has failed these meds in past: Levothyroxine Patient is currently {CHL Controlled/[phone]} on the following medications:  No medications  We discussed:  {CHL HP Upstream Pharmacy [phone]}  Plan  Continue {CHL HP Upstream Pharmacy Plans:[phone]}  Glaucoma   Patient has failed these meds in past: *** Patient is currently {CHL Controlled/Uncontrolled:[id number]} on the following medications:   Bimatoprost (Lumigan) 0.01% eye drops  Combigan eye drops  Timolol 0.25% eye drops  We discussed:  ***  Plan  Continue {CHL HP Upstream Pharmacy Plans:[phone]}  Health Maintenance   Patient is  currently {CHL Controlled/Uncontrolled:6623551796} on the following medications:   Tylenol 500 mg  Diclofenac 1% gel  We discussed:  ***  Plan  Continue {CHL HP Upstream Pharmacy Plans:[phone]}   Medication Management   Pt uses [facility] for all medications Uses pill box? {Yes or If no, why not?:20788} Pt endorses ***% compliance  We discussed: ***  Plan  {US Pharmacy ZBCA:16838}    Follow up: *** month phone visit  ***

## 2020-02-19 ENCOUNTER — Encounter: Payer: Medicare Other | Admitting: Family

## 2020-02-28 ENCOUNTER — Ambulatory Visit (INDEPENDENT_AMBULATORY_CARE_PROVIDER_SITE_OTHER): Payer: Medicare Other | Admitting: Family

## 2020-02-28 ENCOUNTER — Other Ambulatory Visit: Payer: Self-pay

## 2020-02-28 VITALS — BP 148/80 | HR 62 | Temp 98.3°F | Ht 65.0 in | Wt 151.0 lb

## 2020-02-28 DIAGNOSIS — E785 Hyperlipidemia, unspecified: Secondary | ICD-10-CM

## 2020-02-28 DIAGNOSIS — R413 Other amnesia: Secondary | ICD-10-CM

## 2020-02-28 DIAGNOSIS — R5383 Other fatigue: Secondary | ICD-10-CM | POA: Diagnosis not present

## 2020-02-28 DIAGNOSIS — Z1159 Encounter for screening for other viral diseases: Secondary | ICD-10-CM | POA: Diagnosis not present

## 2020-02-28 DIAGNOSIS — R7309 Other abnormal glucose: Secondary | ICD-10-CM | POA: Diagnosis not present

## 2020-02-28 DIAGNOSIS — R030 Elevated blood-pressure reading, without diagnosis of hypertension: Secondary | ICD-10-CM

## 2020-02-28 MED ORDER — SIMVASTATIN 20 MG PO TABS: 20.0000 mg | ORAL_TABLET | Freq: Every day | ORAL | 1 refills | Status: DC

## 2020-02-28 NOTE — Progress Notes (Signed)
Jeffrey Grimes is a 79 y.o. male with the following history as recorded in EpicCare:  Patient Active Problem List   Diagnosis Date Noted  . Memory change 02/01/2019  . Left arm pain 10/23/2018  . PTSD (post-traumatic stress disorder) 08/07/2015  . Insomnia 08/07/2015  . IFG (impaired fasting glucose) 02/05/2014  . Depression 02/05/2014  . Hypothyroidism 08/12/2013  . Glaucoma 08/12/2013  . Essential hypertension, benign 08/12/2013    Current Outpatient Medications  Medication Sig Dispense Refill  . acetaminophen (TYLENOL) 500 MG tablet Take 500 mg by mouth every 6 (six) hours as needed.    Marland Kitchen aspirin EC 81 MG tablet Take 81 mg by mouth daily.    . bimatoprost (LUMIGAN) 0.01 % SOLN Place 1 drop into both eyes at bedtime.     . COMBIGAN 0.2-0.5 % ophthalmic solution Place 1 drop into both eyes daily.     . diclofenac sodium (VOLTAREN) 1 % GEL Apply 4 g topically 4 (four) times daily. 100 g 1  . simvastatin (ZOCOR) 20 MG tablet Take 1 tablet (20 mg total) by mouth at bedtime. 90 tablet 1  . timolol (BETIMOL) 0.25 % ophthalmic solution Place 1 drop into both eyes daily.     No current facility-administered medications for this visit.    Allergies: Patient has no known allergies.  Past Medical History:  Diagnosis Date  . Anxiety   . Colon polyps   . Constipation   . Depression   . Glaucoma   . Heart murmur   . Hypertension   . Thyroid disease     Past Surgical History:  Procedure Laterality Date  . COLONOSCOPY     done in [place] 2010    Family History  Problem Relation Age of Onset  . Diabetes Mother   . Hypertension Mother   . Heart disease Mother   . Hypertension Father   . Heart disease Father   . Thyroid disease Sister   . Hypertension Brother   . Diabetes Brother   . Prostate cancer Brother   . Heart disease Brother     Social History   Tobacco Use  . Smoking status: Former Research scientist (life sciences)  . Smokeless tobacco: Never Used  Substance Use Topics  . Alcohol use:  Yes    Alcohol/week: 0.0 standard drinks    Comment: socially     Subjective:  Presents for follow-up on cholesterol; needs updated refills;  Sees ophthalmology at the [facility] every 3 months for management of his glaucoma; does have a PCP at the [place] as well- seeing them every 6 months also;   Planning to see a neurologist through [place] to evaluate for memory changes;   Defers updating vaccines today;     Objective:  Vitals:   02/28/20 1318  BP: (!) 148/80  Pulse: 62  Temp: 98.3 F (36.8 C)  SpO2: 98%  Weight: 151 lb (68.5 kg)  Height: _0  (1.651 m)    General: Well developed, well nourished, in no acute distress  Head: Normocephalic and atraumatic  Eyes: Sclera and conjunctiva clear; pupils round and reactive to light; extraocular movements intact  Ears: External normal; canals clear; tympanic membranes normal  Oropharynx: Pink, supple. No suspicious lesions  Neck: Supple without thyromegaly, adenopathy  Lungs: Respirations unlabored; clear to auscultation bilaterally without wheeze, rales, rhonchi  CVS exam: normal rate and regular rhythm.  Neurologic: Alert and oriented; speech intact; face symmetrical; moves all extremities well; CNII-XII intact without focal deficit   Assessment:  1. Elevated  glucose   2. Hyperlipidemia, unspecified hyperlipidemia type   3. Need for hepatitis C screening test   4. Other fatigue   5. Elevated blood pressure reading   6. Memory change     Plan:  1. Check Hgba1c; 2. Check lipid panel ;refill updated; 3. Check Hep C ab; 4. Check TSH; 5. Per patient, his PCP at the [facility] is monitoring for him; he will continue to check his pressure regularly and follow-up if consistently above 140/90. 6. Being referred to neurology by [facility] provider;  This visit occurred during the SARS-CoV-2 public health emergency.  Safety protocols were in place, including screening questions prior to the visit, additional usage of staff PPE, and extensive cleaning of exam  room while observing appropriate contact time as indicated for disinfecting solutions.     No follow-ups on file.  Orders Placed This Encounter  Procedures  . CBC with Differential/Platelet    Standing Status:   Future    Standing Expiration Date:   02/27/2021  . Comp Met (CMET)    Standing Status:   Future    Standing Expiration Date:   02/27/2021  . Lipid panel    Standing Status:   Future    Standing Expiration Date:   02/27/2021  . Hemoglobin A1c    Standing Status:   Future    Standing Expiration Date:   02/27/2021  . Hepatitis C Antibody  . TSH    Standing Status:   Future    Standing Expiration Date:   02/27/2021    Requested Prescriptions   Signed Prescriptions Disp Refills  . simvastatin (ZOCOR) 20 MG tablet 90 tablet 1    Sig: Take 1 tablet (20 mg total) by mouth at bedtime.

## 2020-02-28 NOTE — Addendum Note (Signed)
Addended by: Cresenciano Lick on: 02/28/2020 01:52 PM   Modules accepted: Orders

## 2020-02-29 LAB — CBC WITH DIFFERENTIAL/PLATELET
Absolute Monocytes: 476 cells/uL (ref 200–950)
Basophils Absolute: 21 cells/uL (ref 0–200)
Basophils Relative: 0.5 %
Eosinophils Absolute: 139 cells/uL (ref 15–500)
Eosinophils Relative: 3.4 %
HCT: 40.9 % (ref 38.5–50.0)
Hemoglobin: 13.4 g/dL (ref 13.2–17.1)
Lymphs Abs: 1907 cells/uL (ref 850–3900)
MCH: 31.2 pg (ref 27.0–33.0)
MCHC: 32.8 g/dL (ref 32.0–36.0)
MCV: 95.3 fL (ref 80.0–100.0)
MPV: 11.7 fL (ref 7.5–12.5)
Monocytes Relative: 11.6 %
Neutro Abs: 1558 cells/uL (ref 1500–7800)
Neutrophils Relative %: 38 %
Platelets: 171 10*3/uL (ref 140–400)
RBC: 4.29 10*6/uL (ref 4.20–5.80)
RDW: 13.5 % (ref 11.0–15.0)
Total Lymphocyte: 46.5 %
WBC: 4.1 10*3/uL (ref 3.8–10.8)

## 2020-02-29 LAB — COMPREHENSIVE METABOLIC PANEL
AG Ratio: 1.2 (calc) (ref 1.0–2.5)
ALT: 8 U/L — ABNORMAL LOW (ref 9–46)
AST: 16 U/L (ref 10–35)
Albumin: 4.1 g/dL (ref 3.6–5.1)
Alkaline phosphatase (APISO): 52 U/L (ref 35–144)
BUN: 11 mg/dL (ref 7–25)
CO2: 26 mmol/L (ref 20–32)
Calcium: 9.4 mg/dL (ref 8.6–10.3)
Chloride: 105 mmol/L (ref 98–110)
Creat: 1.05 mg/dL (ref 0.70–1.18)
Globulin: 3.3 g/dL (calc) (ref 1.9–3.7)
Glucose, Bld: 98 mg/dL (ref 65–99)
Potassium: 4.2 mmol/L (ref 3.5–5.3)
Sodium: 139 mmol/L (ref 135–146)
Total Bilirubin: 0.4 mg/dL (ref 0.2–1.2)
Total Protein: 7.4 g/dL (ref 6.1–8.1)

## 2020-02-29 LAB — LIPID PANEL
Cholesterol: 242 mg/dL — ABNORMAL HIGH (ref <200)
HDL: 70 mg/dL (ref >=40)
LDL Cholesterol (Calc): 157 mg/dL (calc) — ABNORMAL HIGH
Non-HDL Cholesterol (Calc): 172 mg/dL (calc) — ABNORMAL HIGH (ref <130)
Total CHOL/HDL Ratio: 3.5 (calc) (ref <5.0)
Triglycerides: 62 mg/dL (ref <150)

## 2020-02-29 LAB — HEMOGLOBIN A1C
Hgb A1c MFr Bld: 6.1 % of total Hgb — ABNORMAL HIGH (ref <5.7)
Mean Plasma Glucose: 128 (calc)
eAG (mmol/L): 7.1 (calc)

## 2020-02-29 LAB — TSH: TSH: 3.17 mIU/L (ref 0.40–4.50)

## 2020-03-02 LAB — HEPATITIS C ANTIBODY
Hepatitis C Ab: NONREACTIVE
SIGNAL TO CUT-OFF: 0.02 (ref <1.00)

## 2020-03-03 ENCOUNTER — Other Ambulatory Visit: Payer: Self-pay | Admitting: Family

## 2020-03-03 MED ORDER — ROSUVASTATIN CALCIUM 20 MG PO TABS: 20.0000 mg | ORAL_TABLET | Freq: Every day | ORAL | 0 refills | Status: DC

## 2020-03-05 ENCOUNTER — Telehealth: Payer: Self-pay | Admitting: Internal Medicine

## 2020-03-05 NOTE — Telephone Encounter (Signed)
Pt c/o medication issue:  1. Name of Medication: Crestor  2. How are you currently taking this medication (dosage and times per day)? n/a  3. Are you having a reaction (difficulty breathing--STAT)? n/a  4. What is your medication issue? Patient calling regarding medication being too costly, $400

## 2020-03-06 MED ORDER — ATORVASTATIN CALCIUM 20 MG PO TABS: 20.0000 mg | ORAL_TABLET | Freq: Every day | ORAL | 0 refills | Status: DC

## 2020-03-06 NOTE — Telephone Encounter (Signed)
Called and left message for patient.

## 2020-03-06 NOTE — Addendum Note (Signed)
Addended by: Sherlene Shams on: 03/06/2020 12:51 PM   Modules accepted: Orders

## 2020-03-06 NOTE — Telephone Encounter (Signed)
I will try changing him to Atorvastatin; if cost continues to be a problem, he needs to talk to his provider at the [place] to see what he can get a cheaper price.

## 2020-06-03 ENCOUNTER — Other Ambulatory Visit (HOSPITAL_COMMUNITY): Payer: Self-pay | Admitting: Internal Medicine

## 2020-06-03 ENCOUNTER — Ambulatory Visit: Payer: MEDICARE | Attending: Internal Medicine

## 2020-06-03 DIAGNOSIS — Z23 Encounter for immunization: Secondary | ICD-10-CM

## 2020-06-03 NOTE — Progress Notes (Signed)
° °  Covid-19 Vaccination Clinic  Name:  Jeffrey Grimes    MRN: [id number] DOB: January 07, 1941  06/03/2020  [name] was observed post Covid-19 immunization for 15 minutes without incident. He was provided with Vaccine Information Sheet and instruction to access the V-Safe system.   [name] was instructed to call 911 with any severe reactions post vaccine:  Difficulty breathing   Swelling of face and throat   A fast heartbeat   A bad rash all over body   Dizziness and weakness   Immunizations Administered    Name Date Dose VIS Date Route   Pfizer COVID-19 Vaccine 06/03/2020 10:31 AM 0.3 mL 04/22/2020 Intramuscular   Manufacturer: [place]   Lot: [id number]   [place]: [id number]

## 2020-09-01 ENCOUNTER — Ambulatory Visit: Payer: Medicare PPO | Admitting: Internal Medicine

## 2020-09-01 DIAGNOSIS — Z0289 Encounter for other administrative examinations: Secondary | ICD-10-CM

## 2023-03-04 ENCOUNTER — Other Ambulatory Visit: Payer: Self-pay

## 2023-03-04 ENCOUNTER — Emergency Department (HOSPITAL_COMMUNITY): Payer: Medicare Other

## 2023-03-04 ENCOUNTER — Observation Stay (HOSPITAL_COMMUNITY): Admission: EM | Admit: 2023-03-04 | Payer: Medicare Other | Source: Home / Self Care

## 2023-03-04 ENCOUNTER — Observation Stay (HOSPITAL_COMMUNITY): Payer: Medicare Other

## 2023-03-04 ENCOUNTER — Encounter (HOSPITAL_COMMUNITY): Payer: Self-pay | Admitting: Radiology

## 2023-03-04 DIAGNOSIS — Z79899 Other long term (current) drug therapy: Secondary | ICD-10-CM | POA: Insufficient documentation

## 2023-03-04 DIAGNOSIS — E119 Type 2 diabetes mellitus without complications: Secondary | ICD-10-CM | POA: Diagnosis not present

## 2023-03-04 DIAGNOSIS — M6281 Muscle weakness (generalized): Secondary | ICD-10-CM | POA: Diagnosis not present

## 2023-03-04 DIAGNOSIS — G309 Alzheimer's disease, unspecified: Secondary | ICD-10-CM | POA: Insufficient documentation

## 2023-03-04 DIAGNOSIS — E039 Hypothyroidism, unspecified: Secondary | ICD-10-CM | POA: Diagnosis not present

## 2023-03-04 DIAGNOSIS — R2689 Other abnormalities of gait and mobility: Secondary | ICD-10-CM | POA: Insufficient documentation

## 2023-03-04 DIAGNOSIS — I10 Essential (primary) hypertension: Secondary | ICD-10-CM | POA: Insufficient documentation

## 2023-03-04 DIAGNOSIS — F0280 Dementia in other diseases classified elsewhere without behavioral disturbance: Secondary | ICD-10-CM | POA: Insufficient documentation

## 2023-03-04 DIAGNOSIS — Z7982 Long term (current) use of aspirin: Secondary | ICD-10-CM | POA: Diagnosis not present

## 2023-03-04 DIAGNOSIS — G9340 Encephalopathy, unspecified: Principal | ICD-10-CM | POA: Insufficient documentation

## 2023-03-04 DIAGNOSIS — R4182 Altered mental status, unspecified: Secondary | ICD-10-CM | POA: Diagnosis present

## 2023-03-04 HISTORY — DX: Type 2 diabetes mellitus without complications: E11.9

## 2023-03-04 LAB — CBC WITH DIFFERENTIAL/PLATELET
Abs Immature Granulocytes: 0.01 10*3/uL (ref 0.00–0.07)
Basophils Absolute: 0 10*3/uL (ref 0.0–0.1)
Basophils Relative: 0 %
Eosinophils Absolute: 0.1 10*3/uL (ref 0.0–0.5)
Eosinophils Relative: 1 %
HCT: 30.1 % — ABNORMAL LOW (ref 39.0–52.0)
Hemoglobin: 9.5 g/dL — ABNORMAL LOW (ref 13.0–17.0)
Immature Granulocytes: 0 %
Lymphocytes Relative: 15 %
Lymphs Abs: 0.7 10*3/uL (ref 0.7–4.0)
MCH: 29.2 pg (ref 26.0–34.0)
MCHC: 31.6 g/dL (ref 30.0–36.0)
MCV: 92.6 fL (ref 80.0–100.0)
Monocytes Absolute: 0.8 10*3/uL (ref 0.1–1.0)
Monocytes Relative: 16 %
Neutro Abs: 3.2 10*3/uL (ref 1.7–7.7)
Neutrophils Relative %: 68 %
Platelets: 215 10*3/uL (ref 150–400)
RBC: 3.25 MIL/uL — ABNORMAL LOW (ref 4.22–5.81)
RDW: 15.9 % — ABNORMAL HIGH (ref 11.5–15.5)
WBC: 4.7 10*3/uL (ref 4.0–10.5)
nRBC: 0 % (ref 0.0–0.2)

## 2023-03-04 LAB — BLOOD GAS, VENOUS
Acid-base deficit: 1.2 mmol/L (ref 0.0–2.0)
Bicarbonate: 23.6 mmol/L (ref 20.0–28.0)
O2 Saturation: 74.9 %
Patient temperature: 37
pCO2, Ven: 39 mmHg — ABNORMAL LOW (ref 44–60)
pH, Ven: 7.39 (ref 7.25–7.43)
pO2, Ven: 40 mmHg (ref 32–45)

## 2023-03-04 LAB — AMMONIA: Ammonia: 20 umol/L (ref 9–35)

## 2023-03-04 LAB — MAGNESIUM: Magnesium: 1.9 mg/dL (ref 1.7–2.4)

## 2023-03-04 LAB — COMPREHENSIVE METABOLIC PANEL
ALT: 10 U/L (ref 0–44)
AST: 22 U/L (ref 15–41)
Albumin: 2.7 g/dL — ABNORMAL LOW (ref 3.5–5.0)
Alkaline Phosphatase: 49 U/L (ref 38–126)
Anion gap: 9 (ref 5–15)
BUN: 15 mg/dL (ref 8–23)
CO2: 21 mmol/L — ABNORMAL LOW (ref 22–32)
Calcium: 8.3 mg/dL — ABNORMAL LOW (ref 8.9–10.3)
Chloride: 104 mmol/L (ref 98–111)
Creatinine, Ser: 0.94 mg/dL (ref 0.61–1.24)
GFR, Estimated: >60 mL/min (ref >60)
Glucose, Bld: 137 mg/dL — ABNORMAL HIGH (ref 70–99)
Potassium: 4.3 mmol/L (ref 3.5–5.1)
Sodium: 134 mmol/L — ABNORMAL LOW (ref 135–145)
Total Bilirubin: 0.5 mg/dL (ref 0.3–1.2)
Total Protein: 7 g/dL (ref 6.5–8.1)

## 2023-03-04 LAB — GLUCOSE, CAPILLARY: Glucose-Capillary: 101 mg/dL — ABNORMAL HIGH (ref 70–99)

## 2023-03-04 LAB — CK: Total CK: 208 U/L (ref 49–397)

## 2023-03-04 MED ORDER — ONDANSETRON HCL 4 MG PO TABS: 4.0000 mg | ORAL_TABLET | Freq: Four times a day (QID) | ORAL | Status: DC | PRN

## 2023-03-04 MED ORDER — SIMVASTATIN 20 MG PO TABS
20.0000 mg | ORAL_TABLET | Freq: Every day | ORAL | Status: DC
  Administered 2023-03-06 – 2023-03-07 (×2): 20 mg via ORAL
  Filled 2023-03-04 (×2): qty 1

## 2023-03-04 MED ORDER — POLYETHYLENE GLYCOL 3350 17 G PO PACK: 17.0000 g | PACK | Freq: Every day | ORAL | Status: DC | PRN

## 2023-03-04 MED ORDER — SODIUM CHLORIDE 0.9 % IV BOLUS
1000.0000 mL | Freq: Once | INTRAVENOUS | Status: AC
  Administered 2023-03-04: 1000 mL via INTRAVENOUS

## 2023-03-04 MED ORDER — ONDANSETRON HCL 4 MG/2ML IJ SOLN: 4.0000 mg | Freq: Four times a day (QID) | INTRAMUSCULAR | Status: DC | PRN

## 2023-03-04 MED ORDER — LEVOTHYROXINE SODIUM 25 MCG PO TABS
25.0000 ug | ORAL_TABLET | Freq: Every day | ORAL | Status: DC
  Administered 2023-03-06 – 2023-03-07 (×2): 25 ug via ORAL
  Filled 2023-03-04 (×3): qty 1

## 2023-03-04 MED ORDER — INSULIN ASPART 100 UNIT/ML IJ SOLN
0.0000 [IU] | Freq: Every day | INTRAMUSCULAR | Status: DC
  Filled 2023-03-04: qty 0.05

## 2023-03-04 MED ORDER — ENSURE ENLIVE PO LIQD
237.0000 mL | Freq: Two times a day (BID) | ORAL | Status: DC
  Administered 2023-03-06 – 2023-03-07 (×2): 237 mL via ORAL

## 2023-03-04 MED ORDER — LISINOPRIL 5 MG PO TABS
2.5000 mg | ORAL_TABLET | Freq: Every day | ORAL | Status: DC
  Administered 2023-03-06: 2.5 mg via ORAL
  Filled 2023-03-04 (×2): qty 1

## 2023-03-04 MED ORDER — ENOXAPARIN SODIUM 40 MG/0.4ML IJ SOSY
40.0000 mg | PREFILLED_SYRINGE | INTRAMUSCULAR | Status: DC
  Administered 2023-03-05 – 2023-03-06 (×3): 40 mg via SUBCUTANEOUS
  Filled 2023-03-04 (×3): qty 0.4

## 2023-03-04 MED ORDER — ACETAMINOPHEN 650 MG RE SUPP: 650.0000 mg | Freq: Four times a day (QID) | RECTAL | Status: DC | PRN

## 2023-03-04 MED ORDER — SODIUM CHLORIDE 0.9 % IV SOLN: INTRAVENOUS | Status: AC

## 2023-03-04 MED ORDER — INSULIN ASPART 100 UNIT/ML IJ SOLN
0.0000 [IU] | Freq: Three times a day (TID) | INTRAMUSCULAR | Status: DC
  Filled 2023-03-04: qty 0.06

## 2023-03-04 MED ORDER — ACETAMINOPHEN 325 MG PO TABS: 650.0000 mg | ORAL_TABLET | Freq: Four times a day (QID) | ORAL | Status: DC | PRN

## 2023-03-04 NOTE — ED Provider Notes (Signed)
[place] EMERGENCY DEPARTMENT AT [facility] Provider Note   CSN: [id number] Arrival date & time: 03/04/23  1834     History  Chief Complaint  Patient presents with   Heat Exposure    Jeffrey Grimes is a 82 y.o. male.  82 year old male with past medical history of advanced dementia presenting to the emergency department today after he was apparently found at home with no AC running.  The patient is apparently bedbound with advanced dementia at baseline.  It was over 90 degrees in the patient's house.  This information was gained from medics who were here when the patient arrived to bring him in.  I did try calling the numbers in his chart to get additional information but was unsuccessful reaching anyone.  The history is provided by the EMS personnel.        Home Medications Prior to Admission medications   Medication Sig Start Date End Date Taking? Authorizing Provider  acetaminophen (TYLENOL) 500 MG tablet Take 500 mg by mouth every 6 (six) hours as needed.    [provider]  aspirin EC 81 MG tablet Take 81 mg by mouth daily.    [provider]  atorvastatin (LIPITOR) 20 MG tablet Take 1 tablet (20 mg total) by mouth daily. 03/06/20   Olive Bass, FNP  bimatoprost (LUMIGAN) 0.01 % SOLN Place 1 drop into both eyes at bedtime.     [provider]  COMBIGAN 0.2-0.5 % ophthalmic solution Place 1 drop into both eyes daily.  07/16/14   [provider]  diclofenac sodium (VOLTAREN) 1 % GEL Apply 4 g topically 4 (four) times daily. 06/19/18   Sudie Grumbling, NP  timolol (BETIMOL) 0.25 % ophthalmic solution Place 1 drop into both eyes daily.    [provider]      Allergies    Patient has no known allergies.    Review of Systems   Review of Systems  Reason unable to perform ROS: ROS unobtainable due to dementia and mental status.    Physical Exam Updated Vital Signs BP (!) 150/74   Pulse 63   Temp 99 F  (37.2 C) (Axillary)   Resp (!) 5   Ht 5\' 5"  (1.651 m)   Wt 68.5 kg   SpO2 100%   BMI 25.13 kg/m  Physical Exam Vitals and nursing note reviewed.   Gen: NAD Eyes: PERRL, EOMI HEENT: no oropharyngeal swelling Neck: trachea midline Resp: clear to auscultation bilaterally Card: RRR, no murmurs, rubs, or gallops Abd: nontender, nondistended Extremities: no calf tenderness, no edema Vascular: 2+ radial pulses bilaterally, 2+ DP pulses bilaterally Neuro: The patient will arouse to verbal stimuli, he is weak in all extremities but will follow commands.  He answers yes and no to most questions but not appropriately. Skin: no rashes Psyc: acting appropriately   ED Results / Procedures / Treatments   Labs (all labs ordered are listed, but only abnormal results are displayed) Labs Reviewed  CBC WITH DIFFERENTIAL/PLATELET - Abnormal; Notable for the following components:      Result Value   RBC 3.25 (*)    Hemoglobin 9.5 (*)    HCT 30.1 (*)    RDW 15.9 (*)    All other components within normal limits  COMPREHENSIVE METABOLIC PANEL - Abnormal; Notable for the following components:   Sodium 134 (*)    CO2 21 (*)    Glucose, Bld 137 (*)    Calcium 8.3 (*)  Albumin 2.7 (*)    All other components within normal limits  CK  URINALYSIS, ROUTINE W REFLEX MICROSCOPIC    EKG None  Radiology CT Head Wo Contrast  Result Date: 03/04/2023 CLINICAL DATA:  Nonverbal patient at baseline. Low blood pressure. AC went out at home. EXAM: CT HEAD WITHOUT CONTRAST TECHNIQUE: Contiguous axial images were obtained from the base of the skull through the vertex without intravenous contrast. RADIATION DOSE REDUCTION: This exam was performed according to the departmental dose-optimization program which includes automated exposure control, adjustment of the mA and/or kV according to patient size and/or use of iterative reconstruction technique. COMPARISON:  None Available. FINDINGS: Brain: No  intracranial hemorrhage, mass effect, or evidence of acute infarct. No hydrocephalus. No extra-axial fluid collection. Age-commensurate cerebral atrophy and ill-defined hypoattenuation within the cerebral white matter consistent with chronic small vessel ischemic disease. Vascular: No hyperdense vessel. Intracranial arterial calcification. Skull: No fracture or focal lesion. Sinuses/Orbits: No acute finding. Paranasal sinuses and mastoid air cells are well aerated. Other: None. IMPRESSION: 1. No acute intracranial abnormality. 2. Age-commensurate cerebral atrophy and chronic small vessel ischemic disease. Electronically Signed   By: Minerva Fester M.D.   On: 03/04/2023 21:08    Procedures Procedures    Medications Ordered in ED Medications  sodium chloride 0.9 % bolus 1,000 mL (1,000 mLs Intravenous New Bag/Given 03/04/23 1928)    ED Course/ Medical Decision Making/ A&P                                 Medical Decision Making 82 year old male with past medical history of dementia presenting to the emergency department today with concern for possible heat exposure at his home.  Appears that the patient may not be safe at home at this time.  I will further evaluate patient here with basic labs Wels CK.  Per medics he dropped patient off he is nonverbal at baseline.  I am unable to get in touch with any of his family here to get further information and he has a friend listed here as his primary contact to was not available.  He will likely require admission for social work evaluation and likely placement.  The patient's initial Is unremarkable.  CT scan is ordered.  This shows a lot of chronic changes but nothing acute.  The patient's wife and friend showed up and were able to provide more history.  Seems that the patient does have dementia but is normally able to ambulate and transfer at home without difficulty.  The wife states that there air-conditioning unit did go out and will be repaired tomorrow  and she noted that today that he was a lot less active than normal and was unable to walk at all which he normally is able to at his baseline.  Given these additional findings a urinalysis and chest x-ray were also checked.  This does seem to be a significant change from his baseline state calls placed to hospitalist service for admission for further evaluation and workup.  Amount and/or Complexity of Data Reviewed Labs: ordered. Radiology: ordered.           Final Clinical Impression(s) / ED Diagnoses Final diagnoses:  Altered mental status, unspecified altered mental status type  Disposition: admit  Rx / DC Orders ED Discharge Orders     None         Durwin Glaze, MD 03/04/23 2126

## 2023-03-04 NOTE — ED Triage Notes (Signed)
Pt has dementia and is nonverbal at baseline. Pt AC went out and it is over 90 degrees in there home.   100/56 70 Cbg 208  20R hand

## 2023-03-04 NOTE — H&P (Signed)
History and Physical    Jeffrey Grimes JSE:[id number] DOB: 09/24/1940 DOA: 03/04/2023  PCP: Myrlene Broker, MD   Patient coming from: Home   Chief Complaint: Not talking or ambulating as usual   HPI: Jeffrey Grimes is a 82 y.o. male with medical history significant for hypertension, type 2 diabetes mellitus, hypothyroidism, and Alzheimer dementia who presents to the emergency department with apparent weakness and change in mental status.  Per report of the patient's wife, the patient is typically able to ambulate and somewhat conversant.  Today, he did not get out of bed this morning as usual, has not been talking, and has been unable to ambulate.    ED Course: Upon arrival to the ED, patient is found to be afebrile and saturating well on room air with normal heart rate and stable blood pressure.  EKG demonstrates sinus rhythm with first-degree of nodal block.  Head CT is negative for acute intracranial abnormality.  Labs are notable for normal CK, albumin 2.7, and hemoglobin 9.5.  He was given a liter of normal saline in the ED.  Chest x-ray and urinalysis were ordered in the ED but not yet obtained.  Review of Systems:  ROS limited by patient's clinical condition.  Past Medical History:  Diagnosis Date   Anxiety    Colon polyps    Constipation    Depression    Glaucoma    Heart murmur    Hypertension    Thyroid disease    Type 2 diabetes mellitus (HCC) 03/04/2023    Past Surgical History:  Procedure Laterality Date   COLONOSCOPY     done in [place] 2010    Social History:   reports that he has quit smoking. He has never used smokeless tobacco. He reports current alcohol use. He reports that he does not use drugs.  No Known Allergies  Family History  Problem Relation Age of Onset   Diabetes Mother    Hypertension Mother    Heart disease Mother    Hypertension Father    Heart disease Father    Thyroid disease Sister    Hypertension Brother    Diabetes  Brother    Prostate cancer Brother    Heart disease Brother      Prior to Admission medications   Medication Sig Start Date End Date Taking? Authorizing Provider  acetaminophen (TYLENOL) 500 MG tablet Take 500 mg by mouth every 6 (six) hours as needed.    [provider]  aspirin EC 81 MG tablet Take 81 mg by mouth daily.    [provider]  atorvastatin (LIPITOR) 20 MG tablet Take 1 tablet (20 mg total) by mouth daily. 03/06/20   Olive Bass, FNP  bimatoprost (LUMIGAN) 0.01 % SOLN Place 1 drop into both eyes at bedtime.     [provider]  COMBIGAN 0.2-0.5 % ophthalmic solution Place 1 drop into both eyes daily.  07/16/14   [provider]  diclofenac sodium (VOLTAREN) 1 % GEL Apply 4 g topically 4 (four) times daily. 06/19/18   Sudie Grumbling, NP  timolol (BETIMOL) 0.25 % ophthalmic solution Place 1 drop into both eyes daily.    [provider]    Physical Exam: Vitals:   03/04/23 1848 03/04/23 1900 03/04/23 1903  BP:  (!) 150/74   Pulse:  63   Resp:  (!) 5   Temp:   99 F (37.2 C)  TempSrc:   Axillary  SpO2:  100%   Weight:  68.5 kg    Height: 5\' 5"  (1.651 m)       Constitutional: NAD, calm  Eyes: PERTLA, lids and conjunctivae normal ENMT: Mucous membranes are moist. Posterior pharynx clear of any exudate or lesions.   Neck: supple, no masses  Respiratory:  no wheezing, no crackles. No accessory muscle use.  Cardiovascular: S1 & S2 heard, regular rate and rhythm. No extremity edema.   Abdomen: No distension, no tenderness, soft. Bowel sounds active.  Musculoskeletal: no clubbing / cyanosis. No joint deformity upper and lower extremities.   Skin: no significant rashes, lesions, ulcers. Warm, dry, well-perfused. Neurologic: CN 2-12 grossly intact. Moving all extremities. Opens eyes to voice but not speaking.    Labs and Imaging on Admission: I have personally reviewed following labs and imaging studies  CBC: Recent  Labs  Lab 03/04/23 1906  WBC 4.7  NEUTROABS 3.2  HGB 9.5*  HCT 30.1*  MCV 92.6  PLT 215   Basic Metabolic Panel: Recent Labs  Lab 03/04/23 1906  NA 134*  K 4.3  CL 104  CO2 21*  GLUCOSE 137*  BUN 15  CREATININE 0.94  CALCIUM 8.3*   GFR: Estimated Creatinine Clearance: 54.5 mL/min (by C-G formula based on SCr of 0.94 mg/dL). Liver Function Tests: Recent Labs  Lab 03/04/23 1906  AST 22  ALT 10  ALKPHOS 49  BILITOT 0.5  PROT 7.0  ALBUMIN 2.7*   No results for input(s): "LIPASE", "AMYLASE" in the last 168 hours. No results for input(s): "AMMONIA" in the last 168 hours. Coagulation Profile: No results for input(s): "INR", "PROTIME" in the last 168 hours. Cardiac Enzymes: Recent Labs  Lab 03/04/23 1906  CKTOTAL 208   BNP (last 3 results) No results for input(s): "PROBNP" in the last 8760 hours. HbA1C: No results for input(s): "HGBA1C" in the last 72 hours. CBG: No results for input(s): "GLUCAP" in the last 168 hours. Lipid Profile: No results for input(s): "CHOL", "HDL", "LDLCALC", "TRIG", "CHOLHDL", "LDLDIRECT" in the last 72 hours. Thyroid Function Tests: No results for input(s): "TSH", "T4TOTAL", "FREET4", "T3FREE", "THYROIDAB" in the last 72 hours. Anemia Panel: No results for input(s): "VITAMINB12", "FOLATE", "FERRITIN", "TIBC", "IRON", "RETICCTPCT" in the last 72 hours. Urine analysis: No results found for: "COLORURINE", "APPEARANCEUR", "LABSPEC", "PHURINE", "GLUCOSEU", "HGBUR", "BILIRUBINUR", "KETONESUR", "PROTEINUR", "UROBILINOGEN", "NITRITE", "LEUKOCYTESUR" Sepsis Labs: @LABRCNTIP (procalcitonin:4,lacticidven:4) )No results found for this or any previous visit (from the past 240 hour(s)).   Radiological Exams on Admission: CT Head Wo Contrast  Result Date: 03/04/2023 CLINICAL DATA:  Nonverbal patient at baseline. Low blood pressure. AC went out at home. EXAM: CT HEAD WITHOUT CONTRAST TECHNIQUE: Contiguous axial images were obtained from the base of  the skull through the vertex without intravenous contrast. RADIATION DOSE REDUCTION: This exam was performed according to the departmental dose-optimization program which includes automated exposure control, adjustment of the mA and/or kV according to patient size and/or use of iterative reconstruction technique. COMPARISON:  None Available. FINDINGS: Brain: No intracranial hemorrhage, mass effect, or evidence of acute infarct. No hydrocephalus. No extra-axial fluid collection. Age-commensurate cerebral atrophy and ill-defined hypoattenuation within the cerebral white matter consistent with chronic small vessel ischemic disease. Vascular: No hyperdense vessel. Intracranial arterial calcification. Skull: No fracture or focal lesion. Sinuses/Orbits: No acute finding. Paranasal sinuses and mastoid air cells are well aerated. Other: None. IMPRESSION: 1. No acute intracranial abnormality. 2. Age-commensurate cerebral atrophy and chronic small vessel ischemic disease. Electronically Signed   By: Minerva Fester M.D.   On: 03/04/2023 21:08  EKG: Independently reviewed. Sinus rhythm, 1st degree AV block.   Assessment/Plan   1. Acute encephalopathy   - Use delirium precautions, check TSH, ammonia, B12, blood gas, and UDS   2. Hypertension  - Continue lisinopril    3. Type II DM  - A1c was 6.6% in July 2024  - Check CBGs and use low-intensity SSI for now    4. Dementia  - Delirium precautions    DVT prophylaxis: Lovenox  Code Status: Full  Level of Care: Level of care: Med-Surg Family Communication: Wife at bedside  Disposition Plan:  Patient is from: home  Anticipated d/c is to: TBD Anticipated d/c date is: Possibly as early as 03/05/23  Patient currently: Pending AMS workup, may need PT eval   Consults called: None  Admission status: Observation     Briscoe Deutscher, MD [facility]  03/04/2023, 9:42 PM

## 2023-03-05 DIAGNOSIS — G9340 Encephalopathy, unspecified: Secondary | ICD-10-CM | POA: Diagnosis not present

## 2023-03-05 LAB — VITAMIN B12: Vitamin B-12: 333 pg/mL (ref 180–914)

## 2023-03-05 LAB — BASIC METABOLIC PANEL
Anion gap: 6 (ref 5–15)
BUN: 11 mg/dL (ref 8–23)
CO2: 23 mmol/L (ref 22–32)
Calcium: 8.2 mg/dL — ABNORMAL LOW (ref 8.9–10.3)
Chloride: 110 mmol/L (ref 98–111)
Creatinine, Ser: 0.99 mg/dL (ref 0.61–1.24)
GFR, Estimated: >60 mL/min (ref >60)
Glucose, Bld: 83 mg/dL (ref 70–99)
Potassium: 3.9 mmol/L (ref 3.5–5.1)
Sodium: 139 mmol/L (ref 135–145)

## 2023-03-05 LAB — CBC
HCT: 28.5 % — ABNORMAL LOW (ref 39.0–52.0)
Hemoglobin: 8.7 g/dL — ABNORMAL LOW (ref 13.0–17.0)
MCH: 28.7 pg (ref 26.0–34.0)
MCHC: 30.5 g/dL (ref 30.0–36.0)
MCV: 94.1 fL (ref 80.0–100.0)
Platelets: 172 10*3/uL (ref 150–400)
RBC: 3.03 MIL/uL — ABNORMAL LOW (ref 4.22–5.81)
RDW: 16 % — ABNORMAL HIGH (ref 11.5–15.5)
WBC: 3.8 10*3/uL — ABNORMAL LOW (ref 4.0–10.5)
nRBC: 0 % (ref 0.0–0.2)

## 2023-03-05 LAB — RAPID URINE DRUG SCREEN, HOSP PERFORMED
Amphetamines: NOT DETECTED
Barbiturates: NOT DETECTED
Benzodiazepines: NOT DETECTED
Cocaine: NOT DETECTED
Opiates: NOT DETECTED
Tetrahydrocannabinol: NOT DETECTED

## 2023-03-05 LAB — GLUCOSE, CAPILLARY
Glucose-Capillary: 89 mg/dL (ref 70–99)
Glucose-Capillary: 74 mg/dL (ref 70–99)
Glucose-Capillary: 139 mg/dL — ABNORMAL HIGH (ref 70–99)
Glucose-Capillary: 100 mg/dL — ABNORMAL HIGH (ref 70–99)
Glucose-Capillary: 138 mg/dL — ABNORMAL HIGH (ref 70–99)

## 2023-03-05 LAB — URINALYSIS, ROUTINE W REFLEX MICROSCOPIC
Bilirubin Urine: NEGATIVE
Glucose, UA: NEGATIVE mg/dL
Hgb urine dipstick: NEGATIVE
Ketones, ur: NEGATIVE mg/dL
Leukocytes,Ua: NEGATIVE
Nitrite: NEGATIVE
Protein, ur: NEGATIVE mg/dL
Specific Gravity, Urine: 1.013 (ref 1.005–1.030)
pH: 6 (ref 5.0–8.0)

## 2023-03-05 LAB — TSH: TSH: 2.986 u[IU]/mL (ref 0.350–4.500)

## 2023-03-05 MED ORDER — DEXTROSE 50 % IV SOLN
12.5000 g | INTRAVENOUS | Status: AC
  Administered 2023-03-05: 12.5 g via INTRAVENOUS
  Filled 2023-03-05: qty 50

## 2023-03-05 MED ORDER — DEXTROSE-SODIUM CHLORIDE 5-0.9 % IV SOLN: INTRAVENOUS | Status: AC

## 2023-03-05 NOTE — Progress Notes (Signed)
PROGRESS NOTE    Jeffrey Grimes  ZOX:[id number] DOB: 23-Apr-1941 DOA: 03/04/2023 PCP: Myrlene Broker, MD  Outpatient Specialists:     Brief Narrative:  Patient is an 82 year old male with past medical history significant for hypertension, diabetes mellitus type 2, hypothyroidism and Alzheimer's dementia.  Patient was admitted with apparent weakness and change in mental status.  03/05/2023: No significant history of communication from patient.  Stable vital signs.  BMP is nonrevealing.  WBC is 3.8 with hemoglobin of 8.7 g/dL.  UA reveals specific gravity of 1.013.  CT head without contrast revealed no acute intracranial abnormality and age-commensurate cerebral atrophy and chronic small vessel ischemic disease.   Assessment & Plan:   Principal Problem:   Acute encephalopathy Active Problems:   Essential hypertension   Type 2 diabetes mellitus (HCC)   Alzheimer's dementia (HCC)   1. Acute encephalopathy   -Etiology is unclear. -Possible mild volume depletion. -Use delirium precautions, -TSH was 2.98. -Level was 20. -B12 was 333.   2. Hypertension  - Continue lisinopril   -Blood pressure is controlled.   3. Type II DM  - A1c was 6.6% in July 2024  -Blood sugar dropped to 74 earlier today. -D5 normal saline at 75 cc/h x 13 hours.  Monitor blood sugar closely.    4. Dementia  - Delirium precautions  5.  Mild hyponatremia: -Sodium of 134 on presentation. -Resolved. -Sodium is 139 today.  6.  Volume depletion: -Cautious hydration.   DVT prophylaxis: Subcutaneous Lovenox Code Status: Full code Family Communication:  Disposition Plan:    Consultants:  None  Procedures:  None  Antimicrobials:  None   Subjective: No history from patient.  Objective: Vitals:   03/05/23 0107 03/05/23 0446 03/05/23 0500 03/05/23 0947  BP: 137/79 137/71  (!) 157/77  Pulse: 74 77  60  Resp: 15 15  16   Temp: 98.5 F (36.9 C) 98.6 F (37 C)  98.4 F (36.9 C)   TempSrc: Oral Oral  Oral  SpO2: 100% 100%  100%  Weight:   54.4 kg   Height:        Intake/Output Summary (Last 24 hours) at 03/05/2023 1105 Last data filed at 03/05/2023 0200 Gross per 24 hour  Intake 1320.17 ml  Output --  Net 1320.17 ml   Filed Weights   03/04/23 1848 03/04/23 2328 03/05/23 0500  Weight: 68.5 kg 54.5 kg 54.4 kg    Examination:  General exam: Appears calm and comfortable.  No history from patient. HEENT: Patient is pale. Neck: Supple. Respiratory system: Clear to auscultation.  Cardiovascular system: S1 & S2 heard. Gastrointestinal system: Abdomen is soft and nontender.  Central nervous system: Awake.   Extremities: No leg edema.  Data Reviewed: I have personally reviewed following labs and imaging studies  CBC: Recent Labs  Lab 03/04/23 1906 03/05/23 0333  WBC 4.7 3.8*  NEUTROABS 3.2  --   HGB 9.5* 8.7*  HCT 30.1* 28.5*  MCV 92.6 94.1  PLT 215 172   Basic Metabolic Panel: Recent Labs  Lab 03/04/23 1906 03/04/23 2139 03/05/23 0333  NA 134*  --  139  K 4.3  --  3.9  CL 104  --  110  CO2 21*  --  23  GLUCOSE 137*  --  83  BUN 15  --  11  CREATININE 0.94  --  0.99  CALCIUM 8.3*  --  8.2*  MG  --  1.9  --    GFR: Estimated Creatinine Clearance: 45.8 mL/min (  by C-G formula based on SCr of 0.99 mg/dL). Liver Function Tests: Recent Labs  Lab 03/04/23 1906  AST 22  ALT 10  ALKPHOS 49  BILITOT 0.5  PROT 7.0  ALBUMIN 2.7*   No results for input(s): "LIPASE", "AMYLASE" in the last 168 hours. Recent Labs  Lab 03/04/23 2200  AMMONIA 20   Coagulation Profile: No results for input(s): "INR", "PROTIME" in the last 168 hours. Cardiac Enzymes: Recent Labs  Lab 03/04/23 1906  CKTOTAL 208   BNP (last 3 results) No results for input(s): "PROBNP" in the last 8760 hours. HbA1C: No results for input(s): "HGBA1C" in the last 72 hours. CBG: Recent Labs  Lab 03/04/23 2333 03/05/23 0740  GLUCAP 101* 89   Lipid Profile: No results  for input(s): "CHOL", "HDL", "LDLCALC", "TRIG", "CHOLHDL", "LDLDIRECT" in the last 72 hours. Thyroid Function Tests: Recent Labs    03/04/23 2200  TSH 2.986   Anemia Panel: Recent Labs    03/05/23 0516  VITAMINB12 333   Urine analysis:    Component Value Date/Time   COLORURINE YELLOW 03/05/2023 0328   APPEARANCEUR CLEAR 03/05/2023 0328   LABSPEC 1.013 03/05/2023 0328   PHURINE 6.0 03/05/2023 0328   GLUCOSEU NEGATIVE 03/05/2023 0328   HGBUR NEGATIVE 03/05/2023 0328   BILIRUBINUR NEGATIVE 03/05/2023 0328   KETONESUR NEGATIVE 03/05/2023 0328   PROTEINUR NEGATIVE 03/05/2023 0328   NITRITE NEGATIVE 03/05/2023 0328   LEUKOCYTESUR NEGATIVE 03/05/2023 0328   Sepsis Labs: @LABRCNTIP (procalcitonin:4,lacticidven:4)  )No results found for this or any previous visit (from the past 240 hour(s)).       Radiology Studies: DG Chest Portable 1 View  Result Date: 03/04/2023 CLINICAL DATA:  Altered mental status. EXAM: PORTABLE CHEST 1 VIEW COMPARISON:  None Available. FINDINGS: The heart size and mediastinal contours are within normal limits. There is atherosclerotic calcification of the aorta. No consolidation, effusion, or pneumothorax. No acute osseous abnormality. IMPRESSION: No active disease. Electronically Signed   By: Thornell Sartorius M.D.   On: 03/04/2023 23:11   CT Head Wo Contrast  Result Date: 03/04/2023 CLINICAL DATA:  Nonverbal patient at baseline. Low blood pressure. AC went out at home. EXAM: CT HEAD WITHOUT CONTRAST TECHNIQUE: Contiguous axial images were obtained from the base of the skull through the vertex without intravenous contrast. RADIATION DOSE REDUCTION: This exam was performed according to the departmental dose-optimization program which includes automated exposure control, adjustment of the mA and/or kV according to patient size and/or use of iterative reconstruction technique. COMPARISON:  None Available. FINDINGS: Brain: No intracranial hemorrhage, mass effect, or  evidence of acute infarct. No hydrocephalus. No extra-axial fluid collection. Age-commensurate cerebral atrophy and ill-defined hypoattenuation within the cerebral white matter consistent with chronic small vessel ischemic disease. Vascular: No hyperdense vessel. Intracranial arterial calcification. Skull: No fracture or focal lesion. Sinuses/Orbits: No acute finding. Paranasal sinuses and mastoid air cells are well aerated. Other: None. IMPRESSION: 1. No acute intracranial abnormality. 2. Age-commensurate cerebral atrophy and chronic small vessel ischemic disease. Electronically Signed   By: Minerva Fester M.D.   On: 03/04/2023 21:08        Scheduled Meds:  enoxaparin (LOVENOX) injection  40 mg Subcutaneous Q24H   feeding supplement  237 mL Oral BID BM   insulin aspart  0-5 Units Subcutaneous QHS   insulin aspart  0-6 Units Subcutaneous TID WC   levothyroxine  25 mcg Oral Q0600   lisinopril  2.5 mg Oral Daily   simvastatin  20 mg Oral q1800   Continuous Infusions:  LOS: 0 days    Time spent: 35 minutes.    Berton Mount, MD  [facility] Pager #: [phone] 7PM-7AM contact night coverage as above

## 2023-03-06 DIAGNOSIS — G9340 Encephalopathy, unspecified: Principal | ICD-10-CM

## 2023-03-06 LAB — GLUCOSE, CAPILLARY
Glucose-Capillary: 95 mg/dL (ref 70–99)
Glucose-Capillary: 103 mg/dL — ABNORMAL HIGH (ref 70–99)
Glucose-Capillary: 142 mg/dL — ABNORMAL HIGH (ref 70–99)
Glucose-Capillary: 112 mg/dL — ABNORMAL HIGH (ref 70–99)

## 2023-03-06 NOTE — Progress Notes (Signed)
PROGRESS NOTE  Jeffrey Grimes  MGQ:[id number] DOB: 30-Aug-1940 DOA: 03/04/2023 PCP: Myrlene Broker, MD   Brief Narrative: Patient is 82 year old male with history of hypertension, diabetes type 2, hypothyroidism, Alzheimer's dementia, nonverbal at baseline who was admitted from home with complaint of possible exposure, weakness, change in mental status.  Patient is typically able to ambulate and somewhat conversant but he stopped talking and was unable to ambulate so he was brought to the emergency department.  On presentation he was hemodynamically stable.  CT head was negative for acute findings.  He was given  IV fluids.  PT/OT consulted.  Assessment & Plan:  Principal Problem:   Acute encephalopathy Active Problems:   Essential hypertension   Type 2 diabetes mellitus (HCC)   Alzheimer's dementia (HCC)   Acute encephalopathy/advanced dementia: Patient has advanced dementia.  Confused at baseline, lives at home, ambulates well and has some conversation but apparently found to be more confused, not able to walk so was brought to the emergency department.  As per chart review , there is also mention of patient being nonverbal at baseline.  CT head did not show any acute findings. There was concern for heat exposure, AC not working at home with temperature more than 90 degrees.  CT head did not show any acute findings.  TSH was normal.  CK was normal.  Continue delirium precautions, frequent reorientation.  Given some IV fluids.  Hypertension: On lisinopril.  Blood pressure is stable at this time  Diabetes type 2: Recent A1c of 6.6.  Hyponatremia: Resolved with IV fluid  Hypothyroidism: Continue Synthyroid  Hyperlipidemia: On Simvastatin  Pancytopenia: Most likely chronic.  Continue to monitor  Debility/deconditioning: Patient lives with wife at home.  PT/OT consulted.  There is concern for deconditioning from baseline        DVT prophylaxis:enoxaparin (LOVENOX) injection  40 mg Start: 03/04/23 2245     Code Status: Full Code  Family Communication: None at bedside  Patient status:obs  Patient is from :Home  Anticipated discharge OI:ZTIW  Estimated DC date:1-2 days,awaiting PT eval   Consultants: None  Procedures:None  Antimicrobials:  Anti-infectives (From admission, onward)    None       Subjective: Patient seen and examined at bedside today.  He was lying in bed.  Appears comfortable, eyes closed.  Not in acute distress.  Did not talk or showed in response.  Opens his eyes on calling his name but closes again.  Does not follow commands.  Objective: Vitals:   03/05/23 1737 03/05/23 2112 03/06/23 0500 03/06/23 0519  BP: (!) 145/63 (!) 149/78  (!) 147/83  Pulse: (!) 58 65  64  Resp: 16 18  18   Temp: 98.4 F (36.9 C) 98.3 F (36.8 C)  98.4 F (36.9 C)  TempSrc: Oral Oral  Oral  SpO2: 100% 94%  100%  Weight:   56.1 kg   Height:        Intake/Output Summary (Last 24 hours) at 03/06/2023 1027 Last data filed at 03/06/2023 0600 Gross per 24 hour  Intake 1200.96 ml  Output 950 ml  Net 250.96 ml   Filed Weights   03/04/23 2328 03/05/23 0500 03/06/23 0500  Weight: 54.5 kg 54.4 kg 56.1 kg    Examination:  General exam: Overall comfortable, not in distress, lying in bed, confused HEENT: PERRL Respiratory system:  no wheezes or crackles  Cardiovascular system: S1 & S2 heard, RRR.  Gastrointestinal system: Abdomen is nondistended, soft and nontender. Central nervous system: Awake  but not alert or oriented Extremities: No edema, no clubbing ,no cyanosis Skin: No rashes, no ulcers,no icterus     Data Reviewed: I have personally reviewed following labs and imaging studies  CBC: Recent Labs  Lab 03/04/23 1906 03/05/23 0333  WBC 4.7 3.8*  NEUTROABS 3.2  --   HGB 9.5* 8.7*  HCT 30.1* 28.5*  MCV 92.6 94.1  PLT 215 172   Basic Metabolic Panel: Recent Labs  Lab 03/04/23 1906 03/04/23 2139 03/05/23 0333  NA 134*  --  139   K 4.3  --  3.9  CL 104  --  110  CO2 21*  --  23  GLUCOSE 137*  --  83  BUN 15  --  11  CREATININE 0.94  --  0.99  CALCIUM 8.3*  --  8.2*  MG  --  1.9  --      No results found for this or any previous visit (from the past 240 hour(s)).   Radiology Studies: DG Chest Portable 1 View  Result Date: 03/04/2023 CLINICAL DATA:  Altered mental status. EXAM: PORTABLE CHEST 1 VIEW COMPARISON:  None Available. FINDINGS: The heart size and mediastinal contours are within normal limits. There is atherosclerotic calcification of the aorta. No consolidation, effusion, or pneumothorax. No acute osseous abnormality. IMPRESSION: No active disease. Electronically Signed   By: Thornell Sartorius M.D.   On: 03/04/2023 23:11   CT Head Wo Contrast  Result Date: 03/04/2023 CLINICAL DATA:  Nonverbal patient at baseline. Low blood pressure. AC went out at home. EXAM: CT HEAD WITHOUT CONTRAST TECHNIQUE: Contiguous axial images were obtained from the base of the skull through the vertex without intravenous contrast. RADIATION DOSE REDUCTION: This exam was performed according to the departmental dose-optimization program which includes automated exposure control, adjustment of the mA and/or kV according to patient size and/or use of iterative reconstruction technique. COMPARISON:  None Available. FINDINGS: Brain: No intracranial hemorrhage, mass effect, or evidence of acute infarct. No hydrocephalus. No extra-axial fluid collection. Age-commensurate cerebral atrophy and ill-defined hypoattenuation within the cerebral white matter consistent with chronic small vessel ischemic disease. Vascular: No hyperdense vessel. Intracranial arterial calcification. Skull: No fracture or focal lesion. Sinuses/Orbits: No acute finding. Paranasal sinuses and mastoid air cells are well aerated. Other: None. IMPRESSION: 1. No acute intracranial abnormality. 2. Age-commensurate cerebral atrophy and chronic small vessel ischemic disease.  Electronically Signed   By: Minerva Fester M.D.   On: 03/04/2023 21:08    Scheduled Meds:  enoxaparin (LOVENOX) injection  40 mg Subcutaneous Q24H   feeding supplement  237 mL Oral BID BM   insulin aspart  0-5 Units Subcutaneous QHS   insulin aspart  0-6 Units Subcutaneous TID WC   levothyroxine  25 mcg Oral Q0600   lisinopril  2.5 mg Oral Daily   simvastatin  20 mg Oral q1800   Continuous Infusions:   LOS: 0 days   Burnadette Pop, MD [facility] P9/08/2022, 10:27 AM

## 2023-03-07 DIAGNOSIS — G9340 Encephalopathy, unspecified: Secondary | ICD-10-CM | POA: Diagnosis not present

## 2023-03-07 LAB — GLUCOSE, CAPILLARY
Glucose-Capillary: 116 mg/dL — ABNORMAL HIGH (ref 70–99)
Glucose-Capillary: 110 mg/dL — ABNORMAL HIGH (ref 70–99)

## 2023-03-07 MED ORDER — LEVOTHYROXINE SODIUM 25 MCG PO TABS: 25.0000 ug | ORAL_TABLET | Freq: Every day | ORAL | Status: DC

## 2023-03-07 NOTE — Evaluation (Signed)
Physical Therapy Evaluation Patient Details Name: Jeffrey Grimes MRN: [id number] DOB: 20-Apr-1941 Today's Date: 03/07/2023  History of Present Illness  82 year old male with past medical history of advanced dementia presenting to the emergency department today after he was apparently found at home with no AC running.  The patient is apparently bedbound with advanced dementia at baseline.  It was over 90 degrees in the patient's house. Per chart, MD contacted pt's spouse who reports that he is ambulatory at baseline.  Clinical Impression  Pt admitted with above diagnosis.  Pt aroused with multi-modal stimuli, unable to follow one step commands consistently, required assist of 2 to come to stand and unable to take steps.   Pt able to drink ensure  and take a few bites of applesauce with assist   Pt with limited rehab potential d/t cognitive status. Likely would not progress in SNF setting. Pt may benefit from HHPT if family able to continue care in home setting, if not likely needs LTC.   Pt currently with functional limitations due to the deficits listed below (see PT Problem List). Pt will benefit from acute skilled PT to increase their independence and safety with mobility to allow discharge.           If plan is discharge home, recommend the following: A lot of help with walking and/or transfers;Help with stairs or ramp for entrance;Assistance with cooking/housework;Direct supervision/assist for financial management;Supervision due to cognitive status;Direct supervision/assist for medications management;Assistance with feeding   Can travel by private vehicle        Equipment Recommendations Other (comment) (w/c if does not have)  Recommendations for Other Services       Functional Status Assessment       Precautions / Restrictions Precautions Precautions: Fall Precaution Comments: Recommend skin checks Restrictions Weight Bearing Restrictions: No      Mobility  Bed  Mobility Overal bed mobility: Needs Assistance Bed Mobility: Supine to Sit, Sit to Supine     Supine to sit: Max assist, Mod assist Sit to supine: +2 for physical assistance, +2 for safety/equipment, Max assist   General bed mobility comments: assist to progress LEs off bed and elevate trunk; total assist  to return to and  scoot up in supine    Transfers Overall transfer level: Needs assistance Equipment used: 2 person hand held assist Transfers: Sit to/from Stand Sit to Stand: Max assist, +2 safety/equipment, +2 physical assistance           General transfer comment: stood with assist of 2 to rise, unable to advance RLE in standing, able to move LLE then knees buckled so pt was lowered back to seated position    Ambulation/Gait                  Stairs            Wheelchair Mobility     Tilt Bed    Modified Rankin (Stroke Patients Only)       Balance Overall balance assessment: Needs assistance Sitting-balance support: No upper extremity supported, Feet supported Sitting balance-Leahy Scale: Fair Sitting balance - Comments: once seatead on EOB pt able to static sit   Standing balance support: Bilateral upper extremity supported Standing balance-Leahy Scale: Zero                               Pertinent Vitals/Pain Pain Assessment Breathing: normal Negative Vocalization: occasional moan/groan, low speech, negative/disapproving quality Facial  Expression: facial grimacing Body Language: tense, distressed pacing, fidgeting Consolability: distracted or reassured by voice/touch PAINAD Score: 5 Pain Intervention(s): Monitored during session    Home Living Family/patient expects to be discharged to:: Private residence Living Arrangements: Spouse/significant other Available Help at Discharge: Family               Additional Comments: This OT attempted both phone #s on pt's facesheet, including for spouse and for a relative and both  mailboxes are full. Unable to leave message. Pt unable to provide any information. Pt unable to state his name or DOB.    Prior Function Prior Level of Function : Patient poor historian/Family not available (OT attempted to contact family without success)             Mobility Comments: per chart pt is ambulatory at baseline       Extremity/Trunk Assessment   Upper Extremity Assessment Upper Extremity Assessment: Defer to OT evaluation RUE Deficits / Details: Pt with UEs crossed over chest. Pt resists any passive ROM. With swab in mouth, pt did reach up with his RT hand and remove it. Pt assisted with pillws under each arm but resisting/agitated. RUE: Unable to fully assess due to pain RUE Coordination: decreased fine motor;decreased gross motor LUE Deficits / Details: Same as RT LUE: Unable to fully assess due to pain LUE Coordination: decreased fine motor;decreased gross motor    Lower Extremity Assessment Lower Extremity Assessment: Generalized weakness;RLE deficits/detail;LLE deficits/detail RLE Deficits / Details: moves grossly though functional ROM however is intermittently resistant to imposed movement; unable to fully test strength d/t cognition       Communication   Communication Communication: Difficulty following commands/understanding;Difficulty communicating thoughts/reduced clarity of speech Following commands: Follows one step commands inconsistently Cueing Techniques: Verbal cues;Gestural cues;Tactile cues;Visual cues  Cognition Arousal: Alert Behavior During Therapy: Flat affect Overall Cognitive Status: No family/caregiver present to determine baseline cognitive functioning                                 General Comments: pt initially sleepy, eyes closed; arouse with multi-modal stimuli        General Comments      Exercises     Assessment/Plan    PT Assessment Patient needs continued PT services  PT Problem List Decreased  cognition;Decreased activity tolerance;Decreased balance;Decreased mobility       PT Treatment Interventions Functional mobility training;Therapeutic activities;Patient/family education    PT Goals (Current goals can be found in the Care Plan section)  Acute Rehab PT Goals PT Goal Formulation: Patient unable to participate in goal setting Time For Goal Achievement: 03/21/23 Potential to Achieve Goals: Poor    Frequency Min 1X/week     Co-evaluation               AM-PAC PT "6 Clicks" Mobility  Outcome Measure Help needed turning from your back to your side while in a flat bed without using bedrails?: Total Help needed moving from lying on your back to sitting on the side of a flat bed without using bedrails?: Total Help needed moving to and from a bed to a chair (including a wheelchair)?: Total Help needed standing up from a chair using your arms (e.g., wheelchair or bedside chair)?: Total Help needed to walk in hospital room?: Total Help needed climbing 3-5 steps with a railing? : Total 6 Click Score: 6    End of Session  Activity Tolerance: Other (comment) (cognition) Patient left: with call bell/phone within reach;in bed;with bed alarm set;with nursing/sitter in room Nurse Communication: Mobility status PT Visit Diagnosis: Other abnormalities of gait and mobility (R26.89);Muscle weakness (generalized) (M62.81)    Time: 1610-9604 PT Time Calculation (min) (ACUTE ONLY): 14 min   Charges:   PT Evaluation $PT Eval Low Complexity: 1 Low   PT General Charges $$ ACUTE PT VISIT: 1 Visit         Yolani Vo, PT  Acute Rehab Dept [facility]) [phone]  03/07/2023   [facility] 03/07/2023, 11:16 AM

## 2023-03-07 NOTE — Discharge Summary (Signed)
Physician Discharge Summary  Jeffrey Grimes WUJ:[id number] DOB: 11-12-1940 DOA: 03/04/2023  PCP: Myrlene Broker, MD  Admit date: 03/04/2023 Discharge date: 03/07/2023  Admitted From: Home Disposition:  Home  Discharge Condition:Stable CODE STATUS:FULL Diet recommendation: regular  Brief/Interim Summary: Patient is 82 year old male with history of hypertension, diabetes type 2, hypothyroidism, Alzheimer's dementia, nonverbal at baseline who was admitted from home with complaint of possible exposure, weakness, change in mental status.  Patient is typically able to ambulate and somewhat conversant but he stopped talking and was unable to ambulate so he was brought to the emergency department.  On presentation he was hemodynamically stable.  CT head was negative for acute findings.  He was given  IV fluids.  PT/OT consulted.  Recommended home health.  Currently he is close to his baseline mentation.  Wife states she is ready to take him home.  Home health arranged.  Medically stable for discharge  Following problems were addressed during the hospitalization:  Acute encephalopathy/advanced dementia: Patient has advanced dementia.  Confused at baseline, lives at home, ambulates well and has some conversation but apparently found to be more confused, not able to walk so was brought to the emergency department.  As per chart review , there is also mention of patient being nonverbal at baseline.  CT head did not show any acute findings. There was concern for heat exposure, AC not working at home with temperature more than 90 degrees.  CT head did not show any acute findings.  TSH was normal.   Given some IV fluids.  Now back to baseline mentation   Hypertension: He was started on lisinopril here.  But as per the wife, he is not taking any antihypertensives at home and his PCP has advised not to take because his blood pressure goes low sometimes.  We recommend to follow-up with his PCP as an  outpatient   Diabetes type 2: Recent A1c of 6.6.  We recommend to follow-up with PCP as an outpatient.  Does not take any medication at home.     Hyponatremia: Resolved with IV fluid   Hypothyroidism: Continue Synthyroid   Hyperlipidemia: On Simvastatin   Pancytopenia: Most likely chronic.  Continue to monitor   Debility/deconditioning: Patient lives with wife at home.  PT/OT consulted.  There is concern for deconditioning from baseline   Discharge Diagnoses:  Principal Problem:   Acute encephalopathy Active Problems:   Essential hypertension   Type 2 diabetes mellitus (HCC)   Alzheimer's dementia [facility])    Discharge Instructions  Discharge Instructions     Diet general   Complete by: As directed    Discharge instructions   Complete by: As directed    1)Please take your medications as instructed 2)Follow up with your PCP in a week   Increase activity slowly   Complete by: As directed       Allergies as of 03/07/2023   No Known Allergies      Medication List     STOP taking these medications    atorvastatin 20 MG tablet Commonly known as: LIPITOR       TAKE these medications    acetaminophen 500 MG tablet Commonly known as: TYLENOL Take 500 mg by mouth every 6 (six) hours as needed for moderate pain.   bimatoprost 0.01 % Soln Commonly known as: LUMIGAN Place 1 drop into both eyes at bedtime.   diclofenac sodium 1 % Gel Commonly known as: VOLTAREN Apply 4 g topically 4 (four) times daily. What changed:  when to take this reasons to take this   donepezil 5 MG tablet Commonly known as: ARICEPT Take 5 mg by mouth at bedtime.   levothyroxine 25 MCG tablet Commonly known as: SYNTHROID Take 1 tablet (25 mcg total) by mouth daily at 6 (six) AM. Start taking on: March 08, 2023   simvastatin 20 MG tablet Commonly known as: ZOCOR Take 20 mg by mouth daily.        Follow-up Information     Myrlene Broker, MD. Schedule an appointment  as soon as possible for a visit in 1 week(s).   Specialty: Internal Medicine Contact information: [address] [place] [address] [phone]                No Known Allergies  Consultations: None   Procedures/Studies: DG Chest Portable 1 View  Result Date: 03/04/2023 CLINICAL DATA:  Altered mental status. EXAM: PORTABLE CHEST 1 VIEW COMPARISON:  None Available. FINDINGS: The heart size and mediastinal contours are within normal limits. There is atherosclerotic calcification of the aorta. No consolidation, effusion, or pneumothorax. No acute osseous abnormality. IMPRESSION: No active disease. Electronically Signed   By: Thornell Sartorius M.D.   On: 03/04/2023 23:11   CT Head Wo Contrast  Result Date: 03/04/2023 CLINICAL DATA:  Nonverbal patient at baseline. Low blood pressure. AC went out at home. EXAM: CT HEAD WITHOUT CONTRAST TECHNIQUE: Contiguous axial images were obtained from the base of the skull through the vertex without intravenous contrast. RADIATION DOSE REDUCTION: This exam was performed according to the departmental dose-optimization program which includes automated exposure control, adjustment of the mA and/or kV according to patient size and/or use of iterative reconstruction technique. COMPARISON:  None Available. FINDINGS: Brain: No intracranial hemorrhage, mass effect, or evidence of acute infarct. No hydrocephalus. No extra-axial fluid collection. Age-commensurate cerebral atrophy and ill-defined hypoattenuation within the cerebral white matter consistent with chronic small vessel ischemic disease. Vascular: No hyperdense vessel. Intracranial arterial calcification. Skull: No fracture or focal lesion. Sinuses/Orbits: No acute finding. Paranasal sinuses and mastoid air cells are well aerated. Other: None. IMPRESSION: 1. No acute intracranial abnormality. 2. Age-commensurate cerebral atrophy and chronic small vessel ischemic disease. Electronically Signed   By:  Minerva Fester M.D.   On: 03/04/2023 21:08      Subjective: Patient seen and examined at bedside today.  Hemodynamically stable.  Lying in bed.  Remains confused but not agitated.  Medically stable for discharge back to home.  I called and discussed about discharge planning with wife on phone  Discharge Exam: Vitals:   03/06/23 2212 03/07/23 0629  BP: (!) 140/74 137/84  Pulse: 61 84  Resp: 17 18  Temp: 98.5 F (36.9 C) 98.7 F (37.1 C)  SpO2: 100% 100%   Vitals:   03/06/23 1318 03/06/23 2212 03/07/23 0500 03/07/23 0629  BP: 130/63 (!) 140/74  137/84  Pulse: (!) 59 61  84  Resp: 16 17  18   Temp: 98.8 F (37.1 C) 98.5 F (36.9 C)  98.7 F (37.1 C)  TempSrc: Oral Oral  Oral  SpO2: 100% 100%  100%  Weight:   55.8 kg   Height:        General: Pt is alert, awake, not in acute distress Cardiovascular: RRR, S1/S2 +, no rubs, no gallops Respiratory: CTA bilaterally, no wheezing, no rhonchi Abdominal: Soft, NT, ND, bowel sounds + Extremities: no edema, no cyanosis    The results of significant diagnostics from this hospitalization (including imaging, microbiology, ancillary  and laboratory) are listed below for reference.     Microbiology: No results found for this or any previous visit (from the past 240 hour(s)).   Labs: BNP (last 3 results) No results for input(s): "BNP" in the last 8760 hours. Basic Metabolic Panel: Recent Labs  Lab 03/04/23 1906 03/04/23 2139 03/05/23 0333  NA 134*  --  139  K 4.3  --  3.9  CL 104  --  110  CO2 21*  --  23  GLUCOSE 137*  --  83  BUN 15  --  11  CREATININE 0.94  --  0.99  CALCIUM 8.3*  --  8.2*  MG  --  1.9  --    Liver Function Tests: Recent Labs  Lab 03/04/23 1906  AST 22  ALT 10  ALKPHOS 49  BILITOT 0.5  PROT 7.0  ALBUMIN 2.7*   No results for input(s): "LIPASE", "AMYLASE" in the last 168 hours. Recent Labs  Lab 03/04/23 2200  AMMONIA 20   CBC: Recent Labs  Lab 03/04/23 1906 03/05/23 0333  WBC 4.7  3.8*  NEUTROABS 3.2  --   HGB 9.5* 8.7*  HCT 30.1* 28.5*  MCV 92.6 94.1  PLT 215 172   Cardiac Enzymes: Recent Labs  Lab 03/04/23 1906  CKTOTAL 208   BNP: Invalid input(s): "POCBNP" CBG: Recent Labs  Lab 03/06/23 0731 03/06/23 1135 03/06/23 1624 03/06/23 2214 03/07/23 0730  GLUCAP 95 103* 142* 112* 116*   D-Dimer No results for input(s): "DDIMER" in the last 72 hours. Hgb A1c No results for input(s): "HGBA1C" in the last 72 hours. Lipid Profile No results for input(s): "CHOL", "HDL", "LDLCALC", "TRIG", "CHOLHDL", "LDLDIRECT" in the last 72 hours. Thyroid function studies Recent Labs    03/04/23 2200  TSH 2.986   Anemia work up Recent Labs    03/05/23 0516  VITAMINB12 333   Urinalysis    Component Value Date/Time   COLORURINE YELLOW 03/05/2023 0328   APPEARANCEUR CLEAR 03/05/2023 0328   LABSPEC 1.013 03/05/2023 0328   PHURINE 6.0 03/05/2023 0328   GLUCOSEU NEGATIVE 03/05/2023 0328   HGBUR NEGATIVE 03/05/2023 0328   BILIRUBINUR NEGATIVE 03/05/2023 0328   KETONESUR NEGATIVE 03/05/2023 0328   PROTEINUR NEGATIVE 03/05/2023 0328   NITRITE NEGATIVE 03/05/2023 0328   LEUKOCYTESUR NEGATIVE 03/05/2023 0328   Sepsis Labs Recent Labs  Lab 03/04/23 1906 03/05/23 0333  WBC 4.7 3.8*   Microbiology No results found for this or any previous visit (from the past 240 hour(s)).  Please note: You were cared for by a hospitalist during your hospital stay. Once you are discharged, your primary care physician will handle any further medical issues. Please note that NO REFILLS for any discharge medications will be authorized once you are discharged, as it is imperative that you return to your primary care physician (or establish a relationship with a primary care physician if you do not have one) for your post hospital discharge needs so that they can reassess your need for medications and monitor your lab values.    Time coordinating discharge: 40  minutes  SIGNED:   Burnadette Pop, MD  [facility] 03/07/2023, 11:37 AM Pager [phone]  If 7PM-7AM, please contact night-coverage [email or web address] Password [email or web address]

## 2023-03-07 NOTE — Progress Notes (Signed)
Discharge instructions reviewed with patient's wife, Leandro Reasoner, verbalized understanding. IV removed. Roxanne requesting EMS to transport home. Megan, LCSW made aware.

## 2023-03-07 NOTE — Evaluation (Signed)
Occupational Therapy Evaluation Patient Details Name: Jeffrey Grimes MRN: [id number] DOB: [birth year] Today's Date: 03/07/2023   History of Present Illness 82 year old male with past medical history of advanced dementia presenting to the emergency department today after he was apparently found at home with no AC running.  The patient is apparently bedbound with advanced dementia at baseline.  It was over 90 degrees in the patient's house. Per chart, MD contacted pt's spouse who reports that he is ambulatory at baseline.   Clinical Impression   Occupational Therapy evaluation completed. Unfortunately at this time, patient is unable to follow no significant commands, is total assist for all mobility and all ADLs.  Patient becomes agitated and resists passive movement and repositioning in bed.  Patient is showing no rehab potential at this time, therefore Occupational Therapy to sign off and recommend total comfort care. Please feel free to reorder occupational therapy if patient shows any change in mental status which would allow him to actively participate in self-care and mobility.      If plan is discharge home, recommend the following: Two people to help with walking and/or transfers;Assistance with feeding;Assistance with cooking/housework;Assist for transportation;Direct supervision/assist for financial management;Two people to help with bathing/dressing/bathroom;A lot of help with walking and/or transfers;A lot of help with bathing/dressing/bathroom;Direct supervision/assist for medications management;Supervision due to cognitive status    Functional Status Assessment  Patient has had a recent decline in their functional status and/or demonstrates limited ability to make significant improvements in function in a reasonable and predictable amount of time  Equipment Recommendations  Hoyer lift;Hospital bed    Recommendations for Other Services       Precautions / Restrictions  Precautions Precautions: Fall Precaution Comments: Recommend skin checks Restrictions Weight Bearing Restrictions: No      Mobility Bed Mobility Overal bed mobility: Needs Assistance Bed Mobility: Rolling Rolling: Total assist              Transfers                          Balance                                           ADL either performed or assessed with clinical judgement   ADL Overall ADL's : Needs assistance/impaired                                       General ADL Comments: Pt is currently in need of Total care. Pt did open mouth to apple sauce, and seemed to swallow well, but would not grasp spoon or make attempts to engage in any other ADLs including bed level mouth swab, and face wash. Pt pulls hands awway when presented with warm wash cloth or spoon.     Vision   Additional Comments: Unable to assess due to congition.     Perception         Praxis         Pertinent Vitals/Pain Pain Assessment Pain Assessment: PAINAD Breathing: normal Negative Vocalization: occasional moan/groan, low speech, negative/disapproving quality Facial Expression: facial grimacing Body Language: tense, distressed pacing, fidgeting Consolability: distracted or reassured by voice/touch PAINAD Score: 5 Pain Intervention(s): Limited activity within patient's tolerance, Monitored during session, Repositioned  Extremity/Trunk Assessment Upper Extremity Assessment Upper Extremity Assessment: RUE deficits/detail;LUE deficits/detail RUE Deficits / Details: Pt with UEs crossed over chest. Pt resists any passive ROM. With swab in mouth, pt did reach up with his RT hand and remove it. Pt assisted with pillws under each arm but resisting/agitated. RUE: Unable to fully assess due to pain RUE Coordination: decreased fine motor;decreased gross motor LUE Deficits / Details: Same as RT LUE: Unable to fully assess due to pain LUE  Coordination: decreased fine motor;decreased gross motor           Communication Communication Communication: Difficulty following commands/understanding;Difficulty communicating thoughts/reduced clarity of speech Following commands:  (Follows <25% of commands) Cueing Techniques: Verbal cues;Gestural cues;Tactile cues;Visual cues   Cognition Arousal: Lethargic, Obtunded Behavior During Therapy: Flat affect, Anxious, Agitated Overall Cognitive Status: No family/caregiver present to determine baseline cognitive functioning                                       General Comments       Exercises     Shoulder Instructions      Home Living Family/patient expects to be discharged to:: Private residence Living Arrangements: Spouse/significant other                               Additional Comments: This OT attempted both phone #s on pt's facesheet, including for spouse and for a relative and both mailboxes are full. Unable to leave message. Pt unable to provide any information. Pt unable to state his name or DOB.      Prior Functioning/Environment Prior Level of Function : Patient poor historian/Family not available                        OT Problem List: Decreased cognition;Impaired tone;Decreased range of motion;Decreased safety awareness;Decreased strength;Decreased activity tolerance;Decreased knowledge of use of DME or AE;Impaired UE functional use;Pain;Decreased knowledge of precautions;Decreased coordination      OT Treatment/Interventions:      OT Goals(Current goals can be found in the care plan section) Acute Rehab OT Goals OT Goal Formulation: Patient unable to participate in goal setting Potential to Achieve Goals: Poor  OT Frequency:      Co-evaluation              AM-PAC OT "6 Clicks" Daily Activity     Outcome Measure Help from another person eating meals?: Total Help from another person taking care of personal  grooming?: Total Help from another person toileting, which includes using toliet, bedpan, or urinal?: Total Help from another person bathing (including washing, rinsing, drying)?: Total Help from another person to put on and taking off regular upper body clothing?: Total Help from another person to put on and taking off regular lower body clothing?: Total 6 Click Score: 6   End of Session Equipment Utilized During Treatment: Other (comment) (Recommended Prevalon boots to RN) Nurse Communication: Other (comment) (No Rehab potential at this time. Recommend keeping pt comfortable and Prevalon Boots)  Activity Tolerance: Patient limited by lethargy;Treatment limited secondary to agitation Patient left: in bed;with chair alarm set;with call bell/phone within reach  OT Visit Diagnosis: Adult, failure to thrive (R62.7);Feeding difficulties (R63.3);Other symptoms and signs involving cognitive function;Cognitive communication deficit (R41.841)                Time:  9629-5284 OT Time Calculation (min): 17 min Charges:  OT General Charges $OT Visit: 1 Visit OT Evaluation $OT Eval Low Complexity: 1 Low  Maleigha Colvard, OT Acute Rehab Services Office: [phone] 03/07/2023  Theodoro Clock 03/07/2023, 9:37 AM

## 2023-03-07 NOTE — TOC Transition Note (Addendum)
Transition of Care [facility]) - CM/SW Discharge Note  Patient Details  Name: Jeffrey Grimes MRN: [id number] Date of Birth: [birth year]  Transition of Care [facility]) CM/SW Contact:  Ewing Schlein, LCSW Phone Number: 03/07/2023, 2:17 PM  Clinical Narrative: PT evaluation recommended HHPT. CSW made [facility] referral to [place] with [facility] ([facility]), which was accepted. Hospitalist placed HH orders. CSW updated spouse. TOC signing off.  Addendum: 2:54pm-CSW notified by RN that patient will need PTAR to get home. Medical necessity form done; PTAR scheduled. RN updated.  Final next level of care: Home w Home Health Services Barriers to Discharge: Barriers Resolved  Patient Goals and CMS Choice CMS Medicare.gov Compare Post Acute Care list provided to:: Patient Represenative (must comment) Choice offered to / list presented to : Spouse  Discharge Plan and Services Additional resources added to the After Visit Summary for           DME Arranged: N/A DME Agency: NA HH Arranged: PT HH Agency: [facility] Date [facility] Agency Contacted: 03/07/23 Time HH Agency Contacted: 1235 Representative spoke with at [facility] Agency: Marylene Land  Social Determinants of Health (SDOH) Interventions SDOH Screenings   Food Insecurity: No Food Insecurity (03/04/2023)  Housing: Low Risk  (03/04/2023)  Transportation Needs: No Transportation Needs (03/04/2023)  Utilities: Not At Risk (03/04/2023)  Depression (PHQ2-9): Low Risk  (05/16/2019)  Financial Resource Strain: Low Risk  (12/19/2017)  Physical Activity: Unknown (05/16/2019)  Social Connections: Socially Integrated (12/19/2017)  Stress: No Stress Concern Present (05/16/2019)  Tobacco Use: Medium Risk (03/04/2023)   Readmission Risk Interventions     No data to display

## 2023-03-07 NOTE — Progress Notes (Signed)
Patient just left via PTAR, 2 family members present and have all of patients belongings, family very thankful for the care provided.

## 2023-03-09 ENCOUNTER — Telehealth: Payer: Self-pay | Admitting: Internal Medicine

## 2023-03-09 NOTE — Telephone Encounter (Signed)
Inform them that patient has not been see with you since 2021?

## 2023-03-09 NOTE — Telephone Encounter (Signed)
Jeffrey Grimes from [facility] called wanting verbals for Physical Therapy for 1 week for 6 weeks. The therapist also wanted Rx for a rollator walker with sit.   Best call back number is [phone]

## 2023-03-09 NOTE — Telephone Encounter (Signed)
Called monique and lvm for her stating MD message

## 2023-03-09 NOTE — Telephone Encounter (Signed)
Cannot do any orders. If he has new PCP contact them instead

## 2023-03-23 ENCOUNTER — Emergency Department (HOSPITAL_COMMUNITY): Payer: Medicare Other

## 2023-03-23 ENCOUNTER — Emergency Department (HOSPITAL_COMMUNITY)
Admission: EM | Admit: 2023-03-23 | Discharge: 2023-03-24 | Disposition: A | Discharge status: Home / Self Care | Payer: Medicare Other | Attending: Emergency Medicine | Admitting: Emergency Medicine

## 2023-03-23 DIAGNOSIS — Z79899 Other long term (current) drug therapy: Secondary | ICD-10-CM | POA: Insufficient documentation

## 2023-03-23 DIAGNOSIS — E119 Type 2 diabetes mellitus without complications: Secondary | ICD-10-CM | POA: Insufficient documentation

## 2023-03-23 DIAGNOSIS — L89152 Pressure ulcer of sacral region, stage 2: Secondary | ICD-10-CM | POA: Insufficient documentation

## 2023-03-23 DIAGNOSIS — E039 Hypothyroidism, unspecified: Secondary | ICD-10-CM | POA: Insufficient documentation

## 2023-03-23 DIAGNOSIS — F0280 Dementia in other diseases classified elsewhere without behavioral disturbance: Secondary | ICD-10-CM | POA: Insufficient documentation

## 2023-03-23 DIAGNOSIS — G309 Alzheimer's disease, unspecified: Secondary | ICD-10-CM | POA: Insufficient documentation

## 2023-03-23 DIAGNOSIS — I10 Essential (primary) hypertension: Secondary | ICD-10-CM | POA: Diagnosis not present

## 2023-03-23 DIAGNOSIS — Z87891 Personal history of nicotine dependence: Secondary | ICD-10-CM | POA: Insufficient documentation

## 2023-03-23 DIAGNOSIS — L98429 Non-pressure chronic ulcer of back with unspecified severity: Secondary | ICD-10-CM

## 2023-03-23 LAB — COMPREHENSIVE METABOLIC PANEL
ALT: 21 U/L (ref 0–44)
AST: 41 U/L (ref 15–41)
Albumin: 2.7 g/dL — ABNORMAL LOW (ref 3.5–5.0)
Alkaline Phosphatase: 47 U/L (ref 38–126)
Anion gap: 13 (ref 5–15)
BUN: 15 mg/dL (ref 8–23)
CO2: 20 mmol/L — ABNORMAL LOW (ref 22–32)
Calcium: 8.7 mg/dL — ABNORMAL LOW (ref 8.9–10.3)
Chloride: 99 mmol/L (ref 98–111)
Creatinine, Ser: 0.79 mg/dL (ref 0.61–1.24)
GFR, Estimated: >60 mL/min (ref >60)
Glucose, Bld: 127 mg/dL — ABNORMAL HIGH (ref 70–99)
Potassium: 4.9 mmol/L (ref 3.5–5.1)
Sodium: 132 mmol/L — ABNORMAL LOW (ref 135–145)
Total Bilirubin: 1.4 mg/dL — ABNORMAL HIGH (ref 0.3–1.2)
Total Protein: 7.5 g/dL (ref 6.5–8.1)

## 2023-03-23 LAB — URINALYSIS, W/ REFLEX TO CULTURE (INFECTION SUSPECTED)
Bilirubin Urine: NEGATIVE
Glucose, UA: NEGATIVE mg/dL
Hgb urine dipstick: NEGATIVE
Ketones, ur: 20 mg/dL — AB
Leukocytes,Ua: NEGATIVE
Nitrite: NEGATIVE
Protein, ur: NEGATIVE mg/dL
Specific Gravity, Urine: 1.021 (ref 1.005–1.030)
pH: 5 (ref 5.0–8.0)

## 2023-03-23 LAB — MAGNESIUM: Magnesium: 2.3 mg/dL (ref 1.7–2.4)

## 2023-03-23 LAB — CBC WITH DIFFERENTIAL/PLATELET
Abs Immature Granulocytes: 0.01 10*3/uL (ref 0.00–0.07)
Basophils Absolute: 0 10*3/uL (ref 0.0–0.1)
Basophils Relative: 0 %
Eosinophils Absolute: 0 10*3/uL (ref 0.0–0.5)
Eosinophils Relative: 1 %
HCT: 35.1 % — ABNORMAL LOW (ref 39.0–52.0)
Hemoglobin: 10.3 g/dL — ABNORMAL LOW (ref 13.0–17.0)
Immature Granulocytes: 0 %
Lymphocytes Relative: 20 %
Lymphs Abs: 1.1 10*3/uL (ref 0.7–4.0)
MCH: 28 pg (ref 26.0–34.0)
MCHC: 29.3 g/dL — ABNORMAL LOW (ref 30.0–36.0)
MCV: 95.4 fL (ref 80.0–100.0)
Monocytes Absolute: 0.7 10*3/uL (ref 0.1–1.0)
Monocytes Relative: 12 %
Neutro Abs: 3.5 10*3/uL (ref 1.7–7.7)
Neutrophils Relative %: 67 %
Platelets: 279 10*3/uL (ref 150–400)
RBC: 3.68 MIL/uL — ABNORMAL LOW (ref 4.22–5.81)
RDW: 16.5 % — ABNORMAL HIGH (ref 11.5–15.5)
WBC: 5.3 10*3/uL (ref 4.0–10.5)
nRBC: 0 % (ref 0.0–0.2)

## 2023-03-23 LAB — AMMONIA: Ammonia: <10 umol/L (ref 9–35)

## 2023-03-23 LAB — TSH: TSH: 3.819 u[IU]/mL (ref 0.350–4.500)

## 2023-03-23 MED ORDER — LACTATED RINGERS IV BOLUS
1000.0000 mL | Freq: Once | INTRAVENOUS | Status: AC
  Administered 2023-03-23: 1000 mL via INTRAVENOUS

## 2023-03-23 NOTE — ED Provider Notes (Signed)
[place] EMERGENCY DEPARTMENT AT [facility] Provider Note  CSN: [id number] Arrival date & time: 03/23/23 1434  Chief Complaint(s) Skin Ulcer and Failure To Thrive  HPI Jeffrey Grimes is a 82 y.o. male history of diabetes, Alzheimer's dementia, nonverbal at baseline, recent admission for failure to thrive presenting with recurrent failure to thrive.  Patient reportedly at baseline was able to ambulate, was admitted to the hospital 2 weeks ago and was ambulatory on discharge, plan was for home PT/OT however patient has been apparently again been unable to ambulate, now developing a pressure ulcer.  Brought in for further evaluation.  Patient nonverbal, history limited due to patient nonverbal.   Past Medical History Past Medical History:  Diagnosis Date   Anxiety    Colon polyps    Constipation    Depression    Glaucoma    Heart murmur    Hypertension    Thyroid disease    Type 2 diabetes mellitus (HCC) 03/04/2023   Patient Active Problem List   Diagnosis Date Noted   Acute encephalopathy 03/04/2023   Type 2 diabetes mellitus (HCC) 03/04/2023   Alzheimer's dementia (HCC) 03/04/2023   Memory change 02/01/2019   Left arm pain 10/23/2018   PTSD (post-traumatic stress disorder) 08/07/2015   Insomnia 08/07/2015   IFG (impaired fasting glucose) 02/05/2014   Depression 02/05/2014   Hypothyroidism 08/12/2013   Glaucoma 08/12/2013   Essential hypertension 08/12/2013   Home Medication(s) Prior to Admission medications   Medication Sig Start Date End Date Taking? Authorizing Provider  acetaminophen (TYLENOL) 500 MG tablet Take 500 mg by mouth every 6 (six) hours as needed for moderate pain.    [provider]  bimatoprost (LUMIGAN) 0.01 % SOLN Place 1 drop into both eyes at bedtime.     [provider]  diclofenac sodium (VOLTAREN) 1 % GEL Apply 4 g topically 4 (four) times daily. Patient taking differently: Apply 4 g topically daily as needed (For  pain). 06/19/18   Sudie Grumbling, NP  donepezil (ARICEPT) 5 MG tablet Take 5 mg by mouth at bedtime.    [provider]  levothyroxine (SYNTHROID) 25 MCG tablet Take 25 mcg by mouth daily before breakfast.    [provider]  simvastatin (ZOCOR) 20 MG tablet Take 20 mg by mouth daily.    [provider]                                                                                                                                    Past Surgical History Past Surgical History:  Procedure Laterality Date   COLONOSCOPY     done in [place] 2010   Family History Family History  Problem Relation Age of Onset   Diabetes Mother    Hypertension Mother    Heart disease Mother    Hypertension Father    Heart disease Father    Thyroid disease Sister  Hypertension Brother    Diabetes Brother    Prostate cancer Brother    Heart disease Brother     Social History Social History   Tobacco Use   Smoking status: Former   Smokeless tobacco: Never  Vaping Use   Vaping status: Never Used  Substance Use Topics   Alcohol use: Yes    Alcohol/week: 0.0 standard drinks of alcohol    Comment: socially    Drug use: No   Allergies Patient has no known allergies.  Review of Systems Review of Systems  Unable to perform ROS: Dementia    Physical Exam Vital Signs  I have reviewed the triage vital signs BP 129/74   Pulse 67   Temp (!) 97.5 F (36.4 C) (Oral)   Resp 18   SpO2 100%  Physical Exam Vitals and nursing note reviewed.  Constitutional:      General: He is not in acute distress.    Comments: Thin, frail, awake  HENT:     Head: Normocephalic and atraumatic.     Mouth/Throat:     Mouth: Mucous membranes are moist.  Eyes:     Conjunctiva/sclera: Conjunctivae normal.  Cardiovascular:     Rate and Rhythm: Normal rate and regular rhythm.  Pulmonary:     Effort: Pulmonary effort is normal. No respiratory distress.     Breath sounds: Normal breath  sounds.  Abdominal:     General: Abdomen is flat.     Palpations: Abdomen is soft.     Tenderness: There is no abdominal tenderness.  Genitourinary:    Comments: Chaperoned by nurse tech, large stage II sacral pressure ulcer Musculoskeletal:     Right lower leg: No edema.     Left lower leg: No edema.  Skin:    General: Skin is warm and dry.     Capillary Refill: Capillary refill takes less than 2 seconds.  Neurological:     Comments: Nonverbal, opens eyes,  Psychiatric:        Mood and Affect: Mood normal.        Behavior: Behavior normal.     ED Results and Treatments Labs (all labs ordered are listed, but only abnormal results are displayed) Labs Reviewed  COMPREHENSIVE METABOLIC PANEL - Abnormal; Notable for the following components:      Result Value   Sodium 132 (*)    CO2 20 (*)    Glucose, Bld 127 (*)    Calcium 8.7 (*)    Albumin 2.7 (*)    Total Bilirubin 1.4 (*)    All other components within normal limits  CBC WITH DIFFERENTIAL/PLATELET - Abnormal; Notable for the following components:   RBC 3.68 (*)    Hemoglobin 10.3 (*)    HCT 35.1 (*)    MCHC 29.3 (*)    RDW 16.5 (*)    All other components within normal limits  URINALYSIS, W/ REFLEX TO CULTURE (INFECTION SUSPECTED) - Abnormal; Notable for the following components:   Ketones, ur 20 (*)    Bacteria, UA RARE (*)    All other components within normal limits  MAGNESIUM  AMMONIA  TSH  Radiology CT Head Wo Contrast  Result Date: 03/23/2023 CLINICAL DATA:  New onset headache, dementia EXAM: CT HEAD WITHOUT CONTRAST TECHNIQUE: Contiguous axial images were obtained from the base of the skull through the vertex without intravenous contrast. RADIATION DOSE REDUCTION: This exam was performed according to the departmental dose-optimization program which includes automated exposure control,  adjustment of the mA and/or kV according to patient size and/or use of iterative reconstruction technique. COMPARISON:  03/04/2023 FINDINGS: Brain: Evaluation is significantly limited by patient positioning. Confluent hypodensities are seen throughout the periventricular white matter consistent with chronic small vessel ischemic changes. No evidence of acute infarct or hemorrhage. Lateral ventricles and midline structures are unremarkable. No acute extra-axial air are are os. No mass effect. Vascular: No hyperdense vessel or unexpected calcification. Skull: Normal. Negative for fracture or focal lesion. Sinuses/Orbits: No acute finding. Other: None. IMPRESSION: 1. Limited study due to patient positioning. 2. Chronic small-vessel ischemic changes throughout the white matter. No acute intracranial process. Electronically Signed   By: Sharlet Salina M.D.   On: 03/23/2023 17:18   DG Chest Port 1 View  Result Date: 03/23/2023 CLINICAL DATA:  cough EXAM: PORTABLE CHEST 1 VIEW COMPARISON:  March 04, 2023 FINDINGS: The cardiomediastinal silhouette is unchanged in contour.Atherosclerotic calcifications. No pleural effusion. No pneumothorax. No acute pleuroparenchymal abnormality. IMPRESSION: No acute cardiopulmonary abnormality. Electronically Signed   By: Meda Klinefelter M.D.   On: 03/23/2023 17:09    Pertinent labs & imaging results that were available during my care of the patient were reviewed by me and considered in my medical decision making (see MDM for details).  Medications Ordered in ED Medications  lactated ringers bolus 1,000 mL (1,000 mLs Intravenous New Bag/Given 03/23/23 1626)                                                                                                                                     Procedures Procedures  (including critical care time)  Medical Decision Making / ED Course   MDM:  82 year old presenting to the emergency department with failure to  thrive.  Patient very frail appearing.  Vitals are reassuring.  History limited, family not at bedside.  Does have pressure ulcer which appears new.  Reviewing history it seems patient has progressive dementia.  Seems to be not doing well at home with new pressure ulcer.  Will check labs to evaluate for toxic or metabolic abnormality, occult infectious process.  Will also check chest x-ray.  Will check CT head to evaluate for any intracranial process such as bleeding.  Will attempt to discuss with patient's family.  Seems patient lives with wife.  May need placement or admission if patient has any acute medical issues identified.  Clinical Course as of 03/23/23 2108  Thu Mar 23, 2023  2104 Patient's wife here, reports that he is at his baseline since leaving the hospital.  His laboratory workup is overall reassuring.  He does seem a little bit more interactive after fluids.  She reports that the main reason he is here is because the home health workers called the paramedics to bring him in.  She reports that she is familiar with sacral wounds, has noticed this pop up in the past few days.  She reports she used to work in a nursing home and think she can care for this at home.  Will give some wound care supplies.  Put an order for home health nursing for further wound care.  Discussed need to turn frequently to help prevent further worsening of the wound.  Discussed return precautions for any signs of infection, fevers.  Patient's wife feels comfortable with this plan and understands reasons to return. [WS]    Clinical Course User Index [WS] Lonell Grandchild, MD     Additional history obtained: -Additional history obtained from ems and spouse -External records from outside source obtained and reviewed including: Chart review including previous notes, labs, imaging, consultation notes including previous admission for deconditioning   Lab Tests: -I ordered, reviewed, and interpreted labs.   The  pertinent results include:   Labs Reviewed  COMPREHENSIVE METABOLIC PANEL - Abnormal; Notable for the following components:      Result Value   Sodium 132 (*)    CO2 20 (*)    Glucose, Bld 127 (*)    Calcium 8.7 (*)    Albumin 2.7 (*)    Total Bilirubin 1.4 (*)    All other components within normal limits  CBC WITH DIFFERENTIAL/PLATELET - Abnormal; Notable for the following components:   RBC 3.68 (*)    Hemoglobin 10.3 (*)    HCT 35.1 (*)    MCHC 29.3 (*)    RDW 16.5 (*)    All other components within normal limits  URINALYSIS, W/ REFLEX TO CULTURE (INFECTION SUSPECTED) - Abnormal; Notable for the following components:   Ketones, ur 20 (*)    Bacteria, UA RARE (*)    All other components within normal limits  MAGNESIUM  AMMONIA  TSH    Notable for normal UA, no AKI, no significant electrolyte derangement  EKG   EKG Interpretation Date/Time:  Thursday March 23 2023 19:58:14 EDT Ventricular Rate:  84 PR Interval:  179 QRS Duration:  96 QT Interval:  376 QTC Calculation: 445 R Axis:   66  Text Interpretation: Sinus rhythm Confirmed by Alvino Blood ([id number]) on 03/23/2023 9:02:04 PM         Imaging Studies ordered: I ordered imaging studies including CT head, CXR On my interpretation imaging demonstrates no acute process I independently visualized and interpreted imaging. I agree with the radiologist interpretation   Medicines ordered and prescription drug management: Meds ordered this encounter  Medications   lactated ringers bolus 1,000 mL    -I have reviewed the patients home medicines and have made adjustments as needed    Reevaluation: After the interventions noted above, I reevaluated the patient and found that their symptoms have improved  Co morbidities that complicate the patient evaluation  Past Medical History:  Diagnosis Date   Anxiety    Colon polyps    Constipation    Depression    Glaucoma    Heart murmur    Hypertension     Thyroid disease    Type 2 diabetes mellitus (HCC) 03/04/2023      Dispostion: Disposition decision including need for hospitalization was considered, and patient discharged from emergency department.    Final  Clinical Impression(s) / ED Diagnoses Final diagnoses:  Stage 2 skin ulcer of sacral region [facility])     This chart was dictated using voice recognition software.  Despite best efforts to proofread,  errors can occur which can change the documentation meaning.    Lonell Grandchild, MD 03/23/23 2108

## 2023-03-23 NOTE — ED Triage Notes (Signed)
Pt BIBA from home for FTT and new sacral pressure ulcer. Pt dc about 2 weeks ago and declined since, no longer able to ambulate. Dementia and nonverbal at baseline. Home health has been attempting to keep wound clean  116/66 98% RA 99.4 temp HR 80s

## 2023-03-23 NOTE — Discharge Instructions (Addendum)
We evaluated Jeffrey Grimes for his wound.  His wound appears to be a pressure injury.  This type of wound develops when he is lying on his bottom for a long period.  Please try to move him to his side every few hours to help prevent this from worsening.  Please be sure to have him follow-up with his primary doctor. We have given you wound care supplies.   We have placed a nursing order for nursing home health for wound care.  We have discussed this with our Child psychotherapist.  You should hopefully receive a call for this tomorrow.  Please keep a close eye on his wound.  If he develops any redness around the wound, swelling, drainage of pus, fevers or chills, is not eating and drinking, or any other new symptoms, please bring her back to the emergency department for reassessment.

## 2023-03-24 ENCOUNTER — Telehealth: Payer: Self-pay

## 2023-03-24 NOTE — Telephone Encounter (Signed)
Patient in the ED at [facility], increased [facility] services, currently active with [facility] for PT.  Messaged with Angie, adding RN and OT. They will call the patient

## 2023-03-27 ENCOUNTER — Other Ambulatory Visit: Payer: Self-pay

## 2023-03-27 ENCOUNTER — Emergency Department (HOSPITAL_COMMUNITY): Payer: Medicare Other

## 2023-03-27 ENCOUNTER — Encounter (HOSPITAL_COMMUNITY): Payer: Self-pay

## 2023-03-27 ENCOUNTER — Emergency Department (HOSPITAL_COMMUNITY)
Admission: EM | Admit: 2023-03-27 | Discharge: 2023-03-28 | Disposition: A | Discharge status: Home / Self Care | Payer: Medicare Other | Attending: Emergency Medicine | Admitting: Emergency Medicine

## 2023-03-27 DIAGNOSIS — L89159 Pressure ulcer of sacral region, unspecified stage: Secondary | ICD-10-CM | POA: Diagnosis present

## 2023-03-27 DIAGNOSIS — I10 Essential (primary) hypertension: Secondary | ICD-10-CM | POA: Insufficient documentation

## 2023-03-27 DIAGNOSIS — S31000D Unspecified open wound of lower back and pelvis without penetration into retroperitoneum, subsequent encounter: Secondary | ICD-10-CM

## 2023-03-27 DIAGNOSIS — E039 Hypothyroidism, unspecified: Secondary | ICD-10-CM | POA: Diagnosis not present

## 2023-03-27 DIAGNOSIS — Z79899 Other long term (current) drug therapy: Secondary | ICD-10-CM | POA: Diagnosis not present

## 2023-03-27 DIAGNOSIS — E119 Type 2 diabetes mellitus without complications: Secondary | ICD-10-CM | POA: Diagnosis not present

## 2023-03-27 DIAGNOSIS — F0390 Unspecified dementia without behavioral disturbance: Secondary | ICD-10-CM | POA: Insufficient documentation

## 2023-03-27 LAB — TROPONIN I (HIGH SENSITIVITY)
Troponin I (High Sensitivity): 7 ng/L (ref <18)
Troponin I (High Sensitivity): 7 ng/L (ref <18)

## 2023-03-27 LAB — CBC WITH DIFFERENTIAL/PLATELET
Abs Immature Granulocytes: 0.01 10*3/uL (ref 0.00–0.07)
Basophils Absolute: 0 10*3/uL (ref 0.0–0.1)
Basophils Relative: 0 %
Eosinophils Absolute: 0 10*3/uL (ref 0.0–0.5)
Eosinophils Relative: 1 %
HCT: 30.4 % — ABNORMAL LOW (ref 39.0–52.0)
Hemoglobin: 9.2 g/dL — ABNORMAL LOW (ref 13.0–17.0)
Immature Granulocytes: 0 %
Lymphocytes Relative: 19 %
Lymphs Abs: 0.9 10*3/uL (ref 0.7–4.0)
MCH: 27.3 pg (ref 26.0–34.0)
MCHC: 30.3 g/dL (ref 30.0–36.0)
MCV: 90.2 fL (ref 80.0–100.0)
Monocytes Absolute: 0.6 10*3/uL (ref 0.1–1.0)
Monocytes Relative: 12 %
Neutro Abs: 3.3 10*3/uL (ref 1.7–7.7)
Neutrophils Relative %: 68 %
Platelets: 241 10*3/uL (ref 150–400)
RBC: 3.37 MIL/uL — ABNORMAL LOW (ref 4.22–5.81)
RDW: 16.9 % — ABNORMAL HIGH (ref 11.5–15.5)
WBC: 4.8 10*3/uL (ref 4.0–10.5)
nRBC: 0 % (ref 0.0–0.2)

## 2023-03-27 LAB — COMPREHENSIVE METABOLIC PANEL
ALT: 18 U/L (ref 0–44)
AST: 21 U/L (ref 15–41)
Albumin: 2.3 g/dL — ABNORMAL LOW (ref 3.5–5.0)
Alkaline Phosphatase: 44 U/L (ref 38–126)
Anion gap: 8 (ref 5–15)
BUN: 14 mg/dL (ref 8–23)
CO2: 26 mmol/L (ref 22–32)
Calcium: 8.5 mg/dL — ABNORMAL LOW (ref 8.9–10.3)
Chloride: 106 mmol/L (ref 98–111)
Creatinine, Ser: 0.71 mg/dL (ref 0.61–1.24)
GFR, Estimated: >60 mL/min (ref >60)
Glucose, Bld: 122 mg/dL — ABNORMAL HIGH (ref 70–99)
Potassium: 3.6 mmol/L (ref 3.5–5.1)
Sodium: 140 mmol/L (ref 135–145)
Total Bilirubin: 0.5 mg/dL (ref 0.3–1.2)
Total Protein: 7 g/dL (ref 6.5–8.1)

## 2023-03-27 LAB — I-STAT CG4 LACTIC ACID, ED: Lactic Acid, Venous: 1.2 mmol/L (ref 0.5–1.9)

## 2023-03-27 LAB — MAGNESIUM: Magnesium: 2 mg/dL (ref 1.7–2.4)

## 2023-03-27 MED ORDER — LACTATED RINGERS IV BOLUS (SEPSIS)
1000.0000 mL | Freq: Once | INTRAVENOUS | Status: AC
  Administered 2023-03-27: 1000 mL via INTRAVENOUS

## 2023-03-27 MED ORDER — DOXYCYCLINE HYCLATE 100 MG PO CAPS: 100.0000 mg | ORAL_CAPSULE | Freq: Two times a day (BID) | ORAL | 0 refills | Status: AC

## 2023-03-27 NOTE — ED Notes (Signed)
PTAR notified for transport

## 2023-03-27 NOTE — Discharge Instructions (Addendum)
The prescription for an antibiotic was sent to your pharmacy.  Take as prescribed.  You should hear from social work and/or home health services for possible increased support at home, including wound care support.  Keep wound clean.  Change positioning to offload pressure to this area.  Encourage nutrition. Return to emergency department for any new or worsening symptoms of concern.

## 2023-03-27 NOTE — ED Notes (Signed)
Wound care dressing change to coccyx w/ Ryan,MD assistance. Per MD wound cleansed with wound cleanser, patted dry, xeroform, and mepilex applied.

## 2023-03-27 NOTE — ED Provider Notes (Signed)
[place] [facility] AT [facility] Provider Note   CSN: [id number] Arrival date & time: 03/27/23  1516     History  Chief Complaint  Patient presents with   Wound Check    Jeffrey Grimes is a 82 y.o. male.   Wound Check  Patient presents for worsening sacral wound.  Medical history includes DM, anxiety, depression, HTN, dementia, hypothyroidism.  Recent history includes hospital admission 2 weeks ago for encephalopathy.  He was seen in the ED 4 days ago for failure to thrive.  At that time, he was noted to have lost his ability to ambulate.  He was developing a pressure ulcer.  His workup 4 days ago was unremarkable and wife felt comfortable managing his care at home, including his wound care.  Today, patient arrives via EMS.  They state that wife called due to concerns of worsening sacral wound.  Patient reportedly has home health start by 3 times per week.  Wife otherwise manages his care.  He has not been walking.  He will typically lay in the bed and she will sit him up at times.  EMS reports that he is currently at his mental baseline.  Patient currently not able to provide any history or answer any basic questions.     Home Medications Prior to Admission medications   Medication Sig Start Date End Date Taking? Authorizing Provider  doxycycline (VIBRAMYCIN) 100 MG capsule Take 1 capsule (100 mg total) by mouth 2 (two) times daily for 7 days. 03/27/23 04/03/23 Yes Gloris Manchester, MD  acetaminophen (TYLENOL) 500 MG tablet Take 500 mg by mouth every 6 (six) hours as needed for moderate pain.    [provider]  bimatoprost (LUMIGAN) 0.01 % SOLN Place 1 drop into both eyes at bedtime.     [provider]  diclofenac sodium (VOLTAREN) 1 % GEL Apply 4 g topically 4 (four) times daily. Patient taking differently: Apply 4 g topically daily as needed (For pain). 06/19/18   Sudie Grumbling, NP  donepezil (ARICEPT) 5 MG tablet Take 5 mg by mouth at  bedtime.    [provider]  levothyroxine (SYNTHROID) 25 MCG tablet Take 25 mcg by mouth daily before breakfast.    [provider]  simvastatin (ZOCOR) 20 MG tablet Take 20 mg by mouth daily.    [provider]      Allergies    Patient has no known allergies.    Review of Systems   Review of Systems  Unable to perform ROS: Dementia    Physical Exam Updated Vital Signs BP (!) 145/77   Pulse 66   Temp 98.6 F (37 C) (Oral)   Resp 15   Ht 5\' 5"  (1.651 m)   Wt 55.8 kg   SpO2 100%   BMI 20.47 kg/m  Physical Exam Vitals and nursing note reviewed.  Constitutional:      General: He is not in acute distress.    Appearance: He is well-developed. He is cachectic. He is ill-appearing (Chronically). He is not toxic-appearing or diaphoretic.  HENT:     Head: Normocephalic and atraumatic.     Right Ear: External ear normal.     Left Ear: External ear normal.     Nose: Nose normal.     Mouth/Throat:     Mouth: Mucous membranes are moist.  Eyes:     Extraocular Movements: Extraocular movements intact.     Conjunctiva/sclera: Conjunctivae normal.  Cardiovascular:  Rate and Rhythm: Normal rate and regular rhythm.     Heart sounds: No murmur heard. Pulmonary:     Effort: Pulmonary effort is normal. No respiratory distress.     Breath sounds: Normal breath sounds. No wheezing or rales.  Chest:     Chest wall: No tenderness.  Abdominal:     General: There is no distension.     Palpations: Abdomen is soft.     Tenderness: There is no abdominal tenderness.  Musculoskeletal:        General: No swelling.     Cervical back: Normal range of motion and neck supple.     Right lower leg: No edema.     Left lower leg: No edema.  Skin:    General: Skin is warm and dry.     Coloration: Skin is not jaundiced or pale.     Comments: Large sacral pressure ulcer.  Small, superficial skin tear to posterior left shoulder.  Neurological:     General: No focal  deficit present.     Mental Status: He is alert.  Psychiatric:        Mood and Affect: Mood normal.        Behavior: Behavior normal.     ED Results / Procedures / Treatments   Labs (all labs ordered are listed, but only abnormal results are displayed) Labs Reviewed  CBC WITH DIFFERENTIAL/PLATELET - Abnormal; Notable for the following components:      Result Value   RBC 3.37 (*)    Hemoglobin 9.2 (*)    HCT 30.4 (*)    RDW 16.9 (*)    All other components within normal limits  COMPREHENSIVE METABOLIC PANEL - Abnormal; Notable for the following components:   Glucose, Bld 122 (*)    Calcium 8.5 (*)    Albumin 2.3 (*)    All other components within normal limits  CULTURE, BLOOD (ROUTINE X 2)  CULTURE, BLOOD (ROUTINE X 2)  MAGNESIUM  URINALYSIS, W/ REFLEX TO CULTURE (INFECTION SUSPECTED)  I-STAT CG4 LACTIC ACID, ED  I-STAT CHEM 8, ED  TROPONIN I (HIGH SENSITIVITY)  TROPONIN I (HIGH SENSITIVITY)    EKG EKG Interpretation Date/Time:  Monday March 27 2023 15:26:36 EDT Ventricular Rate:  92 PR Interval:  152 QRS Duration:  95 QT Interval:  351 QTC Calculation: 435 R Axis:   74  Text Interpretation: Sinus tachycardia Ventricular premature complex Borderline T abnormalities, anterior leads Artifact in lead(s) V4 V5 Confirmed by Gloris Manchester [phone]) on 03/27/2023 4:17:23 PM  Radiology DG Chest Port 1 View  Result Date: 03/27/2023 CLINICAL DATA:  Questionable sepsis EXAM: PORTABLE CHEST 1 VIEW COMPARISON:  Chest x-ray 03/23/2023 FINDINGS: The heart size and mediastinal contours are within normal limits. Both lungs are clear. The visualized skeletal structures are unremarkable. IMPRESSION: No active disease. Electronically Signed   By: Darliss Cheney M.D.   On: 03/27/2023 18:05    Procedures Procedures    Medications Ordered in ED Medications  lactated ringers bolus 1,000 mL (0 mLs Intravenous Stopped 03/27/23 2037)    ED Course/ Medical Decision Making/ A&P                                  Medical Decision Making Amount and/or Complexity of Data Reviewed Labs: ordered. Radiology: ordered. ECG/medicine tests: ordered.   This patient presents to the ED for concern of sacral wound, this involves an extensive number of treatment  options, and is a complaint that carries with it a high risk of complications and morbidity.  The differential diagnosis includes chronic pressure ulcer, cellulitis, malnutrition   Co morbidities that complicate the patient evaluation  DM, anxiety, depression, HTN, dementia, hypothyroidism   Additional history obtained:  Additional history obtained from patient's wife External records from outside source obtained and reviewed including EMR   Lab Tests:  I Ordered, and personally interpreted labs.  The pertinent results include: Normal kidney function, normal electrolytes, baseline anemia, no leukocytosis, normal lactate, normal troponin   Imaging Studies ordered:  I ordered imaging studies including chest x-ray I independently visualized and interpreted imaging which showed no acute findings I agree with the radiologist interpretation   Cardiac Monitoring: / EKG:  The patient was maintained on a cardiac monitor.  I personally viewed and interpreted the cardiac monitored which showed an underlying rhythm of: Sinus rhythm  Problem List / ED Course / Critical interventions / Medication management  Patient presents for worsening sacral wound.  He arrives via EMS.  Additional history is provided by EMS.  Patient lives at home with wife.  He has home health care 3 days/week.  Wife is concerned of worsening sacral wound.  On inspection of sacral wound, he does have sloughing of skin some drainage.  He is currently not speaking which is reportedly his mental baseline.  Vital signs are normal.  Laboratory workup was initiated.  Patient's wife arrived and joined him at bedside.  She confirmed that her wound concern was that the  sacral wound seems to be worsening.  She describes worsening as increased in size.  She has home health that comes by 6 days/week.  A home nurse comes by 3 days/week.  Patient's wife has been performing wound care.  She feels that she needs more help.  Lab results were unremarkable.  Patient's wife does feel comfortable with discharge home.  She does request an antibiotic.  Although there is no clear evidence of infection, I feel this is reasonable.  Face-to-face orders placed for possible increased help at home, including wound care.  Currently, patient's [facility].  She is still considering this.  Patient's sacral wound was cleansed and dressed while in the ED.  Wound care materials were provided to patient's wife.  Patient was discharged in stable condition. I ordered medication including IV fluids for hydration Reevaluation of the patient after these medicines showed that the patient improved I have reviewed the patients home medicines and have made adjustments as needed   Social Determinants of Health:  Has dementia, bedbound at baseline, lives at home with wife, has home health care support        Final Clinical Impression(s) / ED Diagnoses Final diagnoses:  Wound of sacral region, subsequent encounter    Rx / DC Orders ED Discharge Orders          Ordered    doxycycline (VIBRAMYCIN) 100 MG capsule  2 times daily        03/27/23 2205              Gloris Manchester, MD 03/27/23 2206

## 2023-03-27 NOTE — ED Triage Notes (Signed)
Patient BIB EMS for progressively worsening sacral wound. Patient has advanced dementia and is unable to tell us his hx. Wife is not here at the moment.

## 2023-03-28 NOTE — ED Notes (Signed)
Wife called and made aware that Pt is en route to their residence.

## 2023-03-30 LAB — CULTURE, BLOOD (ROUTINE X 2)

## 2023-04-01 LAB — CULTURE, BLOOD (ROUTINE X 2)
Culture: NO GROWTH
Culture: NO GROWTH

## 2023-05-05 DEATH — deceased
